# Patient Record
Sex: Male | Born: 1955 | Race: White | Hispanic: No | Marital: Married | Smoking: Current some day smoker
Health system: Southern US, Community
[De-identification: ages and names within clinical notes are randomized; demographics above are authoritative.]

## PROBLEM LIST (undated history)

## (undated) DIAGNOSIS — M1990 Unspecified osteoarthritis, unspecified site: Secondary | ICD-10-CM

## (undated) DIAGNOSIS — E119 Type 2 diabetes mellitus without complications: Secondary | ICD-10-CM

## (undated) DIAGNOSIS — Z9989 Dependence on other enabling machines and devices: Secondary | ICD-10-CM

## (undated) DIAGNOSIS — M25579 Pain in unspecified ankle and joints of unspecified foot: Secondary | ICD-10-CM

## (undated) DIAGNOSIS — M792 Neuralgia and neuritis, unspecified: Secondary | ICD-10-CM

## (undated) DIAGNOSIS — I10 Essential (primary) hypertension: Secondary | ICD-10-CM

## (undated) DIAGNOSIS — R7303 Prediabetes: Secondary | ICD-10-CM

## (undated) DIAGNOSIS — G4733 Obstructive sleep apnea (adult) (pediatric): Secondary | ICD-10-CM

## (undated) DIAGNOSIS — E7800 Pure hypercholesterolemia, unspecified: Secondary | ICD-10-CM

## (undated) DIAGNOSIS — F32A Depression, unspecified: Secondary | ICD-10-CM

## (undated) DIAGNOSIS — F329 Major depressive disorder, single episode, unspecified: Secondary | ICD-10-CM

## (undated) HISTORY — PX: ROTATOR CUFF REPAIR: SHX139

## (undated) HISTORY — PX: VASECTOMY REVERSAL: SHX243

## (undated) HISTORY — PX: VASECTOMY: SHX75

## (undated) HISTORY — PX: OTHER SURGICAL HISTORY: SHX169

---

## 2018-12-03 ENCOUNTER — Other Ambulatory Visit: Payer: Self-pay | Admitting: Orthopedic Surgery

## 2018-12-15 NOTE — Patient Instructions (Addendum)
Lyndell Allaire  12/15/2018   Your procedure is scheduled on: 12-25-2018    Report to [facility] Main  Entrance     Report to admitting at 7:30AM    Call this number if you have problems the morning of surgery [phone]   PLEASE BRING CPAP MASK AND  TUBING ONLY. DEVICE WILL BE PROVIDED!     Remember: Do not eat food or drink liquids :After Midnight. BRUSH YOUR TEETH MORNING OF SURGERY AND RINSE YOUR MOUTH OUT, NO CHEWING GUM CANDY OR MINTS.     Take these medicines the morning of surgery with A SIP OF WATER: ALLOPURINOL, AMLODIPINE, GEMFIBROZIL, METOPROLOL, ROSUVASTATIN                                You may not have any metal on your body including hair pins and              piercings  Do not wear jewelry, make-up, lotions, powders or perfumes, deodorant                      Men may shave face and neck.   Do not bring valuables to the hospital. [facility] IS NOT             RESPONSIBLE   FOR VALUABLES.  Contacts, dentures or bridgework may not be worn into surgery.  Leave suitcase in the car. After surgery it may be brought to your room.                  Please read over the following fact sheets you were given: _____________________________________________________________________             [facility] - Preparing for Surgery Before surgery, you can play an important role.  Because skin is not sterile, your skin needs to be as free of germs as possible.  You can reduce the number of germs on your skin by washing with CHG (chlorahexidine gluconate) soap before surgery.  CHG is an antiseptic cleaner which kills germs and bonds with the skin to continue killing germs even after washing. Please DO NOT use if you have an allergy to CHG or antibacterial soaps.  If your skin becomes reddened/irritated stop using the CHG and inform your nurse when you arrive at Short Stay. Do not shave (including legs and underarms) for at least 48 hours prior to the  first CHG shower.  You may shave your face/neck. Please follow these instructions carefully:  1.  Shower with CHG Soap the night before surgery and the  morning of Surgery.  2.  If you choose to wash your hair, wash your hair first as usual with your  normal  shampoo.  3.  After you shampoo, rinse your hair and body thoroughly to remove the  shampoo.                           4.  Use CHG as you would any other liquid soap.  You can apply chg directly  to the skin and wash                       Gently with a scrungie or clean washcloth.  5.  Apply the CHG Soap to your body ONLY  FROM THE NECK DOWN.   Do not use on face/ open                           Wound or open sores. Avoid contact with eyes, ears mouth and genitals (private parts).                       Wash face,  Genitals (private parts) with your normal soap.             6.  Wash thoroughly, paying special attention to the area where your surgery  will be performed.  7.  Thoroughly rinse your body with warm water from the neck down.  8.  DO NOT shower/wash with your normal soap after using and rinsing off  the CHG Soap.                9.  Pat yourself dry with a clean towel.            10.  Wear clean pajamas.            11.  Place clean sheets on your bed the night of your first shower and do not  sleep with pets. Day of Surgery : Do not apply any lotions/deodorants the morning of surgery.  Please wear clean clothes to the hospital/surgery center.  FAILURE TO FOLLOW THESE INSTRUCTIONS MAY RESULT IN THE CANCELLATION OF YOUR SURGERY PATIENT SIGNATURE_________________________________  NURSE SIGNATURE__________________________________  ________________________________________________________________________   Rogelia Mire  An incentive spirometer is a tool that can help keep your lungs clear and active. This tool measures how well you are filling your lungs with each breath. Taking long deep breaths may help reverse or decrease  the chance of developing breathing (pulmonary) problems (especially infection) following:  A long period of time when you are unable to move or be active. BEFORE THE PROCEDURE   If the spirometer includes an indicator to show your best effort, your nurse or respiratory therapist will set it to a desired goal.  If possible, sit up straight or lean slightly forward. Try not to slouch.  Hold the incentive spirometer in an upright position. INSTRUCTIONS FOR USE  1. Sit on the edge of your bed if possible, or sit up as far as you can in bed or on a chair. 2. Hold the incentive spirometer in an upright position. 3. Breathe out normally. 4. Place the mouthpiece in your mouth and seal your lips tightly around it. 5. Breathe in slowly and as deeply as possible, raising the piston or the ball toward the top of the column. 6. Hold your breath for 3-5 seconds or for as long as possible. Allow the piston or ball to fall to the bottom of the column. 7. Remove the mouthpiece from your mouth and breathe out normally. 8. Rest for a few seconds and repeat Steps 1 through 7 at least 10 times every 1-2 hours when you are awake. Take your time and take a few normal breaths between deep breaths. 9. The spirometer may include an indicator to show your best effort. Use the indicator as a goal to work toward during each repetition. 10. After each set of 10 deep breaths, practice coughing to be sure your lungs are clear. If you have an incision (the cut made at the time of surgery), support your incision when coughing by placing a pillow or rolled up towels firmly against it. Once you  are able to get out of bed, walk around indoors and cough well. You may stop using the incentive spirometer when instructed by your caregiver.  RISKS AND COMPLICATIONS  Take your time so you do not get dizzy or light-headed.  If you are in pain, you may need to take or ask for pain medication before doing incentive spirometry. It is  harder to take a deep breath if you are having pain. AFTER USE  Rest and breathe slowly and easily.  It can be helpful to keep track of a log of your progress. Your caregiver can provide you with a simple table to help with this. If you are using the spirometer at home, follow these instructions: SEEK MEDICAL CARE IF:   You are having difficultly using the spirometer.  You have trouble using the spirometer as often as instructed.  Your pain medication is not giving enough relief while using the spirometer.  You develop fever of 100.5 F (38.1 C) or higher. SEEK IMMEDIATE MEDICAL CARE IF:   You cough up bloody sputum that had not been present before.  You develop fever of 102 F (38.9 C) or greater.  You develop worsening pain at or near the incision site. MAKE SURE YOU:   Understand these instructions.  Will watch your condition.  Will get help right away if you are not doing well or get worse. Document Released: 03/10/2007 Document Revised: 01/20/2012 Document Reviewed: 05/11/2007 [facility] Patient Information 2014 [place], [place].   ________________________________________________________________________

## 2018-12-17 ENCOUNTER — Encounter (HOSPITAL_COMMUNITY): Payer: Self-pay

## 2018-12-17 ENCOUNTER — Other Ambulatory Visit: Payer: Self-pay

## 2018-12-17 ENCOUNTER — Ambulatory Visit (HOSPITAL_COMMUNITY)
Admission: RE | Admit: 2018-12-17 | Discharge: 2018-12-17 | Disposition: A | Discharge status: Home / Self Care | Payer: Worker's Compensation | Source: Ambulatory Visit | Attending: Orthopedic Surgery | Admitting: Orthopedic Surgery

## 2018-12-17 ENCOUNTER — Inpatient Hospital Stay (HOSPITAL_COMMUNITY): Admission: RE | Admit: 2018-12-17 | Payer: Self-pay | Source: Ambulatory Visit

## 2018-12-17 ENCOUNTER — Encounter (HOSPITAL_COMMUNITY)
Admission: RE | Admit: 2018-12-17 | Discharge: 2018-12-17 | Disposition: A | Discharge status: Home / Self Care | Payer: Worker's Compensation | Source: Ambulatory Visit | Attending: Orthopedic Surgery | Admitting: Orthopedic Surgery

## 2018-12-17 DIAGNOSIS — Z01811 Encounter for preprocedural respiratory examination: Secondary | ICD-10-CM | POA: Diagnosis present

## 2018-12-17 HISTORY — DX: Essential (primary) hypertension: I10

## 2018-12-17 HISTORY — DX: Unspecified osteoarthritis, unspecified site: M19.90

## 2018-12-17 HISTORY — DX: Pure hypercholesterolemia, unspecified: E78.00

## 2018-12-17 HISTORY — DX: Obstructive sleep apnea (adult) (pediatric): G47.33

## 2018-12-17 HISTORY — DX: Prediabetes: R73.03

## 2018-12-17 HISTORY — DX: Dependence on other enabling machines and devices: Z99.89

## 2018-12-17 HISTORY — DX: Major depressive disorder, single episode, unspecified: F32.9

## 2018-12-17 HISTORY — DX: Neuralgia and neuritis, unspecified: M79.2

## 2018-12-17 HISTORY — DX: Depression, unspecified: F32.A

## 2018-12-17 HISTORY — DX: Pain in unspecified ankle and joints of unspecified foot: M25.579

## 2018-12-17 LAB — CBC WITH DIFFERENTIAL/PLATELET
Abs Immature Granulocytes: 0.01 10*3/uL (ref 0.00–0.07)
BASOS PCT: 1 %
Basophils Absolute: 0 10*3/uL (ref 0.0–0.1)
EOS ABS: 0.9 10*3/uL — AB (ref 0.0–0.5)
Eosinophils Relative: 13 %
HCT: 44.2 % (ref 39.0–52.0)
Hemoglobin: 14.4 g/dL (ref 13.0–17.0)
Immature Granulocytes: 0 %
Lymphocytes Relative: 30 %
Lymphs Abs: 2 10*3/uL (ref 0.7–4.0)
MCH: 30.5 pg (ref 26.0–34.0)
MCHC: 32.6 g/dL (ref 30.0–36.0)
MCV: 93.6 fL (ref 80.0–100.0)
Monocytes Absolute: 0.5 10*3/uL (ref 0.1–1.0)
Monocytes Relative: 8 %
Neutro Abs: 3.2 10*3/uL (ref 1.7–7.7)
Neutrophils Relative %: 48 %
Platelets: 339 10*3/uL (ref 150–400)
RBC: 4.72 MIL/uL (ref 4.22–5.81)
RDW: 13.6 % (ref 11.5–15.5)
WBC: 6.6 10*3/uL (ref 4.0–10.5)
nRBC: 0 % (ref 0.0–0.2)

## 2018-12-17 LAB — ABO/RH: ABO/RH(D): O POS

## 2018-12-17 LAB — COMPREHENSIVE METABOLIC PANEL
ALT: 77 U/L — ABNORMAL HIGH (ref 0–44)
ANION GAP: 10 (ref 5–15)
AST: 140 U/L — ABNORMAL HIGH (ref 15–41)
Albumin: 4.2 g/dL (ref 3.5–5.0)
Alkaline Phosphatase: 75 U/L (ref 38–126)
BUN: 18 mg/dL (ref 6–20)
CO2: 27 mmol/L (ref 22–32)
Calcium: 9.7 mg/dL (ref 8.9–10.3)
Chloride: 100 mmol/L (ref 98–111)
Creatinine, Ser: 1.04 mg/dL (ref 0.61–1.24)
GFR calc Af Amer: >60 mL/min (ref >60)
GFR calc non Af Amer: >60 mL/min (ref >60)
Glucose, Bld: 131 mg/dL — ABNORMAL HIGH (ref 70–99)
Potassium: 4 mmol/L (ref 3.5–5.1)
Sodium: 137 mmol/L (ref 135–145)
Total Bilirubin: 0.4 mg/dL (ref 0.3–1.2)
Total Protein: 7.6 g/dL (ref 6.5–8.1)

## 2018-12-17 LAB — URINALYSIS, ROUTINE W REFLEX MICROSCOPIC
Bilirubin Urine: NEGATIVE
Glucose, UA: NEGATIVE mg/dL
Hgb urine dipstick: NEGATIVE
KETONES UR: NEGATIVE mg/dL
Leukocytes, UA: NEGATIVE
Nitrite: NEGATIVE
Protein, ur: NEGATIVE mg/dL
Specific Gravity, Urine: 1.015 (ref 1.005–1.030)
pH: 6 (ref 5.0–8.0)

## 2018-12-17 LAB — APTT: aPTT: 33 seconds (ref 24–36)

## 2018-12-17 LAB — HEMOGLOBIN A1C
Hgb A1c MFr Bld: 6.4 % — ABNORMAL HIGH (ref 4.8–5.6)
Mean Plasma Glucose: 136.98 mg/dL

## 2018-12-17 LAB — PROTIME-INR
INR: 0.99
PROTHROMBIN TIME: 13 s (ref 11.4–15.2)

## 2018-12-17 LAB — SURGICAL PCR SCREEN
MRSA, PCR: NEGATIVE
Staphylococcus aureus: POSITIVE — AB

## 2018-12-22 NOTE — Progress Notes (Signed)
Anesthesia Chart Review   Case:  [id number] Date/Time:  12/25/18 5631   Procedure:  RIGHT TOTAL KNEE ARTHROPLASTY (Right )   Anesthesia type:  General   Pre-op diagnosis:  DEGENERATIVE JOINT DISEASE RIGHT KNEE   Location:  [facility] ROOM 08 / [facility]   Surgeon:  Jodi Geralds, MD      DISCUSSION: 63 yo current some day smoker with h/o HTN, pre-diabetes, depression, OSA on CPAP, right knee DJD scheduled for above surgery on 12/25/18 with Dr. Jodi Geralds.   AST 140 ALT 77 at PST visit on 12/17/18.  Previous labs dated 09/22/17 reviewed from PCP, normal at this time.  Discussed with Dr. Lilli Light and Dr. Michelle Piper.  Most likely due to medications, ok to proceed with planned procedure.    VS: BP (!) 146/77   Pulse 66   Temp 36.9 C (Oral)   Resp 18   Ht 5' 11.5" (1.816 m)   Wt 113.4 kg   SpO2 97%   BMI 34.38 kg/m   PROVIDERS: Oval Linsey, PA-C is PCP   LABS: Labs reviewed: Acceptable for surgery. (all labs ordered are listed, but only abnormal results are displayed)  Labs Reviewed  SURGICAL PCR SCREEN - Abnormal; Notable for the following components:      Result Value   Staphylococcus aureus POSITIVE (*)    All other components within normal limits  CBC WITH DIFFERENTIAL/PLATELET - Abnormal; Notable for the following components:   Eosinophils Absolute 0.9 (*)    All other components within normal limits  COMPREHENSIVE METABOLIC PANEL - Abnormal; Notable for the following components:   Glucose, Bld 131 (*)    AST 140 (*)    ALT 77 (*)    All other components within normal limits  HEMOGLOBIN A1C - Abnormal; Notable for the following components:   Hgb A1c MFr Bld 6.4 (*)    All other components within normal limits  URINALYSIS, ROUTINE W REFLEX MICROSCOPIC  APTT  PROTIME-INR  TYPE AND SCREEN  ABO/RH     IMAGES: Chest Xray 12/17/2018 FINDINGS: Cardiomediastinal silhouette is normal. Mediastinal contours appear intact.  There is no evidence of focal airspace consolidation,  pleural effusion or pneumothorax.  Osseous structures are without acute abnormality. Soft tissues are grossly normal.  IMPRESSION: No active cardiopulmonary disease.  EKG: 12/17/2018 Normal sinus rhythm  Normal ECG No previous tracing   CV:  Past Medical History:  Diagnosis Date  . Ankle pain   . Depression    "secondary to pain"   . DJD (degenerative joint disease)   . Elevated cholesterol   . Hypertension   . Nerve pain   . OSA on CPAP   . Pre-diabetes    "borderline"    Past Surgical History:  Procedure Laterality Date  . arthroscopy  Right    knee x2  . ROTATOR CUFF REPAIR Bilateral   . VASECTOMY    . VASECTOMY REVERSAL      MEDICATIONS: . allopurinol (ZYLOPRIM) 100 MG tablet  . amLODipine (NORVASC) 10 MG tablet  . cholecalciferol (VITAMIN D3) 25 MCG (1000 UT) tablet  . gemfibrozil (LOPID) 600 MG tablet  . hydrochlorothiazide (HYDRODIURIL) 25 MG tablet  . losartan (COZAAR) 100 MG tablet  . meloxicam (MOBIC) 7.5 MG tablet  . metoprolol tartrate (LOPRESSOR) 50 MG tablet  . Multiple Vitamins-Minerals (MULTIVITAMIN WITH MINERALS) tablet  . Omega-3 1000 MG CAPS  . rosuvastatin (CRESTOR) 20 MG tablet   No current facility-administered medications for this encounter.      Shanda Bumps  [facility] Pre-Surgical Testing [phone] 12/23/18 12:13 PM

## 2018-12-23 NOTE — Anesthesia Preprocedure Evaluation (Addendum)
Anesthesia Evaluation  Patient identified by MRN, date of birth, ID band Patient awake    Reviewed: Allergy & Precautions, NPO status , Patient's Chart, lab work & pertinent test results, reviewed documented beta blocker date and time   Airway Mallampati: II  TM Distance: <3 FB Neck ROM: Full    Dental  (+) Dental Advisory Given   Pulmonary Current Smoker,    breath sounds clear to auscultation       Cardiovascular hypertension, Pt. on medications and Pt. on home beta blockers  Rhythm:Regular Rate:Normal     Neuro/Psych negative neurological ROS     GI/Hepatic negative GI ROS, Neg liver ROS,   Endo/Other  negative endocrine ROS  Renal/GU negative Renal ROS     Musculoskeletal  (+) Arthritis ,   Abdominal   Peds  Hematology negative hematology ROS (+)   Anesthesia Other Findings   Reproductive/Obstetrics                            Lab Results  Component Value Date   WBC 6.6 12/17/2018   HGB 14.4 12/17/2018   HCT 44.2 12/17/2018   MCV 93.6 12/17/2018   PLT 339 12/17/2018   Lab Results  Component Value Date   CREATININE 1.04 12/17/2018   BUN 18 12/17/2018   NA 137 12/17/2018   K 4.0 12/17/2018   CL 100 12/17/2018   CO2 27 12/17/2018   Lab Results  Component Value Date   INR 0.99 12/17/2018    Anesthesia Physical Anesthesia Plan  ASA: II  Anesthesia Plan: MAC and Spinal   Post-op Pain Management:  Regional for Post-op pain   Induction: Intravenous  PONV Risk Score and Plan: 1 and Propofol infusion, Ondansetron and Treatment may vary due to age or medical condition  Airway Management Planned: Simple Face Mask and Natural Airway  Additional Equipment:   Intra-op Plan:   Post-operative Plan:   Informed Consent: I have reviewed the patients History and Physical, chart, labs and discussed the procedure including the risks, benefits and alternatives for the proposed  anesthesia with the patient or authorized representative who has indicated his/her understanding and acceptance.     Dental advisory given  Plan Discussed with: CRNA  Anesthesia Plan Comments:        Anesthesia Quick Evaluation

## 2018-12-24 MED ORDER — BUPIVACAINE LIPOSOME 1.3 % IJ SUSP
20.0000 mL | INTRAMUSCULAR | Status: DC
  Filled 2018-12-24: qty 20

## 2018-12-24 NOTE — Progress Notes (Signed)
rn spoke with patient to make aware he will now check in at 0700 at admitting tomorrow for surgery. Patient verbalized understanding

## 2018-12-25 ENCOUNTER — Observation Stay (HOSPITAL_COMMUNITY)
Admission: RE | Admit: 2018-12-25 | Discharge: 2018-12-26 | Disposition: A | Discharge status: Home / Self Care | Payer: Worker's Compensation | Attending: Orthopedic Surgery | Admitting: Orthopedic Surgery

## 2018-12-25 ENCOUNTER — Ambulatory Visit (HOSPITAL_COMMUNITY): Payer: Worker's Compensation | Admitting: Physician Assistant

## 2018-12-25 ENCOUNTER — Encounter (HOSPITAL_COMMUNITY)
Admission: RE | Disposition: A | Discharge status: Home / Self Care | Payer: Self-pay | Source: Home / Self Care | Attending: Orthopedic Surgery

## 2018-12-25 ENCOUNTER — Encounter (HOSPITAL_COMMUNITY): Payer: Self-pay | Admitting: Emergency Medicine

## 2018-12-25 ENCOUNTER — Ambulatory Visit (HOSPITAL_COMMUNITY): Payer: Worker's Compensation | Admitting: Anesthesiology

## 2018-12-25 ENCOUNTER — Other Ambulatory Visit: Payer: Self-pay

## 2018-12-25 DIAGNOSIS — F329 Major depressive disorder, single episode, unspecified: Secondary | ICD-10-CM | POA: Insufficient documentation

## 2018-12-25 DIAGNOSIS — G4733 Obstructive sleep apnea (adult) (pediatric): Secondary | ICD-10-CM | POA: Insufficient documentation

## 2018-12-25 DIAGNOSIS — M1711 Unilateral primary osteoarthritis, right knee: Secondary | ICD-10-CM | POA: Diagnosis not present

## 2018-12-25 DIAGNOSIS — E7800 Pure hypercholesterolemia, unspecified: Secondary | ICD-10-CM | POA: Insufficient documentation

## 2018-12-25 DIAGNOSIS — Z791 Long term (current) use of non-steroidal anti-inflammatories (NSAID): Secondary | ICD-10-CM | POA: Insufficient documentation

## 2018-12-25 DIAGNOSIS — R2689 Other abnormalities of gait and mobility: Secondary | ICD-10-CM | POA: Diagnosis not present

## 2018-12-25 DIAGNOSIS — R7303 Prediabetes: Secondary | ICD-10-CM | POA: Diagnosis not present

## 2018-12-25 DIAGNOSIS — R262 Difficulty in walking, not elsewhere classified: Secondary | ICD-10-CM | POA: Diagnosis not present

## 2018-12-25 DIAGNOSIS — F17210 Nicotine dependence, cigarettes, uncomplicated: Secondary | ICD-10-CM | POA: Diagnosis not present

## 2018-12-25 DIAGNOSIS — Z79899 Other long term (current) drug therapy: Secondary | ICD-10-CM | POA: Insufficient documentation

## 2018-12-25 DIAGNOSIS — M25579 Pain in unspecified ankle and joints of unspecified foot: Secondary | ICD-10-CM | POA: Diagnosis not present

## 2018-12-25 DIAGNOSIS — I10 Essential (primary) hypertension: Secondary | ICD-10-CM | POA: Diagnosis not present

## 2018-12-25 HISTORY — PX: TOTAL KNEE ARTHROPLASTY: SHX125

## 2018-12-25 LAB — TYPE AND SCREEN
ABO/RH(D): O POS
Antibody Screen: NEGATIVE

## 2018-12-25 SURGERY — ARTHROPLASTY, KNEE, TOTAL
Anesthesia: Monitor Anesthesia Care | Site: Knee | Laterality: Right

## 2018-12-25 MED ORDER — CHLORHEXIDINE GLUCONATE 4 % EX LIQD: 60.0000 mL | Freq: Once | CUTANEOUS | Status: DC

## 2018-12-25 MED ORDER — MAGNESIUM CITRATE PO SOLN: 1.0000 | Freq: Once | ORAL | Status: DC | PRN

## 2018-12-25 MED ORDER — GEMFIBROZIL 600 MG PO TABS
600.0000 mg | ORAL_TABLET | Freq: Every day | ORAL | Status: DC
  Administered 2018-12-26: 600 mg via ORAL
  Filled 2018-12-25: qty 1

## 2018-12-25 MED ORDER — HYDROMORPHONE HCL 1 MG/ML IJ SOLN
INTRAMUSCULAR | Status: AC
  Filled 2018-12-25: qty 1

## 2018-12-25 MED ORDER — METHOCARBAMOL 500 MG IVPB - SIMPLE MED
INTRAVENOUS | Status: AC
  Filled 2018-12-25: qty 50

## 2018-12-25 MED ORDER — MIDAZOLAM HCL 2 MG/2ML IJ SOLN
INTRAMUSCULAR | Status: AC
  Filled 2018-12-25: qty 2

## 2018-12-25 MED ORDER — OXYCODONE HCL 5 MG PO TABS
5.0000 mg | ORAL_TABLET | ORAL | Status: DC | PRN
  Administered 2018-12-25: 10 mg via ORAL
  Administered 2018-12-25: 5 mg via ORAL
  Administered 2018-12-26 (×2): 10 mg via ORAL
  Filled 2018-12-25: qty 2
  Filled 2018-12-25: qty 1
  Filled 2018-12-25 (×2): qty 2

## 2018-12-25 MED ORDER — LOSARTAN POTASSIUM 50 MG PO TABS
100.0000 mg | ORAL_TABLET | Freq: Every day | ORAL | Status: DC
  Administered 2018-12-26: 100 mg via ORAL
  Filled 2018-12-25: qty 2

## 2018-12-25 MED ORDER — LACTATED RINGERS IV SOLN
INTRAVENOUS | Status: DC
  Administered 2018-12-25 (×2): via INTRAVENOUS

## 2018-12-25 MED ORDER — DOCUSATE SODIUM 100 MG PO CAPS: 100.0000 mg | ORAL_CAPSULE | Freq: Two times a day (BID) | ORAL | 0 refills | Status: DC

## 2018-12-25 MED ORDER — PROPOFOL 500 MG/50ML IV EMUL
INTRAVENOUS | Status: DC | PRN
  Administered 2018-12-25: 100 ug/kg/min via INTRAVENOUS

## 2018-12-25 MED ORDER — BUPIVACAINE-EPINEPHRINE (PF) 0.5% -1:200000 IJ SOLN
INTRAMUSCULAR | Status: AC
  Filled 2018-12-25: qty 30

## 2018-12-25 MED ORDER — TRANEXAMIC ACID-NACL 1000-0.7 MG/100ML-% IV SOLN
1000.0000 mg | Freq: Once | INTRAVENOUS | Status: AC
  Administered 2018-12-25: 1000 mg via INTRAVENOUS
  Filled 2018-12-25: qty 100

## 2018-12-25 MED ORDER — FENTANYL CITRATE (PF) 100 MCG/2ML IJ SOLN
50.0000 ug | INTRAMUSCULAR | Status: DC
  Administered 2018-12-25: 50 ug via INTRAVENOUS
  Filled 2018-12-25: qty 2

## 2018-12-25 MED ORDER — WATER FOR IRRIGATION, STERILE IR SOLN
Status: DC | PRN
  Administered 2018-12-25: 1000 mL

## 2018-12-25 MED ORDER — BUPIVACAINE LIPOSOME 1.3 % IJ SUSP
INTRAMUSCULAR | Status: DC | PRN
  Administered 2018-12-25: 20 mL

## 2018-12-25 MED ORDER — BUPIVACAINE IN DEXTROSE 0.75-8.25 % IT SOLN
INTRATHECAL | Status: DC | PRN
  Administered 2018-12-25: 1.6 mL via INTRATHECAL

## 2018-12-25 MED ORDER — DOCUSATE SODIUM 100 MG PO CAPS
100.0000 mg | ORAL_CAPSULE | Freq: Two times a day (BID) | ORAL | Status: DC
  Administered 2018-12-25 – 2018-12-26 (×2): 100 mg via ORAL
  Filled 2018-12-25 (×2): qty 1

## 2018-12-25 MED ORDER — CLONIDINE HCL (ANALGESIA) 100 MCG/ML EP SOLN
EPIDURAL | Status: DC | PRN
  Administered 2018-12-25: 50 ug

## 2018-12-25 MED ORDER — ASPIRIN EC 325 MG PO TBEC: 325.0000 mg | DELAYED_RELEASE_TABLET | Freq: Two times a day (BID) | ORAL | 0 refills | Status: DC

## 2018-12-25 MED ORDER — PROPOFOL 10 MG/ML IV BOLUS
INTRAVENOUS | Status: AC
  Filled 2018-12-25: qty 20

## 2018-12-25 MED ORDER — POLYETHYLENE GLYCOL 3350 17 G PO PACK: 17.0000 g | PACK | Freq: Every day | ORAL | Status: DC | PRN

## 2018-12-25 MED ORDER — MIDAZOLAM HCL 2 MG/2ML IJ SOLN
1.0000 mg | INTRAMUSCULAR | Status: DC
  Administered 2018-12-25: 2 mg via INTRAVENOUS
  Filled 2018-12-25: qty 2

## 2018-12-25 MED ORDER — TIZANIDINE HCL 2 MG PO TABS: 2.0000 mg | ORAL_TABLET | Freq: Three times a day (TID) | ORAL | 0 refills | Status: DC | PRN

## 2018-12-25 MED ORDER — CELECOXIB 200 MG PO CAPS
200.0000 mg | ORAL_CAPSULE | Freq: Two times a day (BID) | ORAL | Status: DC
  Administered 2018-12-25 – 2018-12-26 (×3): 200 mg via ORAL
  Filled 2018-12-25 (×3): qty 1

## 2018-12-25 MED ORDER — SODIUM CHLORIDE (PF) 0.9 % IJ SOLN
INTRAMUSCULAR | Status: AC
  Filled 2018-12-25: qty 50

## 2018-12-25 MED ORDER — ONDANSETRON HCL 4 MG/2ML IJ SOLN
INTRAMUSCULAR | Status: DC | PRN
  Administered 2018-12-25: 4 mg via INTRAVENOUS

## 2018-12-25 MED ORDER — ALLOPURINOL 100 MG PO TABS
100.0000 mg | ORAL_TABLET | Freq: Every day | ORAL | Status: DC
  Administered 2018-12-26: 100 mg via ORAL
  Filled 2018-12-25: qty 1

## 2018-12-25 MED ORDER — PROPOFOL 10 MG/ML IV BOLUS
INTRAVENOUS | Status: AC
  Filled 2018-12-25: qty 60

## 2018-12-25 MED ORDER — METOPROLOL TARTRATE 50 MG PO TABS
50.0000 mg | ORAL_TABLET | Freq: Two times a day (BID) | ORAL | Status: DC
  Administered 2018-12-25 – 2018-12-26 (×2): 50 mg via ORAL
  Filled 2018-12-25 (×2): qty 1

## 2018-12-25 MED ORDER — ACETAMINOPHEN 325 MG PO TABS: 325.0000 mg | ORAL_TABLET | Freq: Four times a day (QID) | ORAL | Status: DC | PRN

## 2018-12-25 MED ORDER — SODIUM CHLORIDE 0.9 % IV SOLN
INTRAVENOUS | Status: DC
  Administered 2018-12-25: 16:00:00 via INTRAVENOUS

## 2018-12-25 MED ORDER — HYDROCHLOROTHIAZIDE 25 MG PO TABS
25.0000 mg | ORAL_TABLET | Freq: Every day | ORAL | Status: DC
  Administered 2018-12-26: 25 mg via ORAL
  Filled 2018-12-25: qty 1

## 2018-12-25 MED ORDER — HYDROMORPHONE HCL 1 MG/ML IJ SOLN
0.5000 mg | INTRAMUSCULAR | Status: DC | PRN
  Administered 2018-12-25: 1 mg via INTRAVENOUS
  Filled 2018-12-25: qty 1

## 2018-12-25 MED ORDER — ONDANSETRON HCL 4 MG/2ML IJ SOLN: 4.0000 mg | Freq: Four times a day (QID) | INTRAMUSCULAR | Status: DC | PRN

## 2018-12-25 MED ORDER — DEXAMETHASONE SODIUM PHOSPHATE 10 MG/ML IJ SOLN
10.0000 mg | Freq: Two times a day (BID) | INTRAMUSCULAR | Status: DC
  Administered 2018-12-25 – 2018-12-26 (×2): 10 mg via INTRAVENOUS
  Filled 2018-12-25 (×2): qty 1

## 2018-12-25 MED ORDER — ROSUVASTATIN CALCIUM 10 MG PO TABS
10.0000 mg | ORAL_TABLET | Freq: Every day | ORAL | Status: DC
  Administered 2018-12-26: 10 mg via ORAL
  Filled 2018-12-25: qty 1

## 2018-12-25 MED ORDER — SODIUM CHLORIDE 0.9% FLUSH
INTRAVENOUS | Status: DC | PRN
  Administered 2018-12-25: 50 mL

## 2018-12-25 MED ORDER — DEXAMETHASONE SODIUM PHOSPHATE 10 MG/ML IJ SOLN
INTRAMUSCULAR | Status: AC
  Filled 2018-12-25: qty 1

## 2018-12-25 MED ORDER — ONDANSETRON HCL 4 MG/2ML IJ SOLN
INTRAMUSCULAR | Status: AC
  Filled 2018-12-25: qty 2

## 2018-12-25 MED ORDER — TRANEXAMIC ACID-NACL 1000-0.7 MG/100ML-% IV SOLN
1000.0000 mg | INTRAVENOUS | Status: AC
  Administered 2018-12-25: 1000 mg via INTRAVENOUS
  Filled 2018-12-25: qty 100

## 2018-12-25 MED ORDER — AMLODIPINE BESYLATE 10 MG PO TABS
10.0000 mg | ORAL_TABLET | Freq: Every day | ORAL | Status: DC
  Administered 2018-12-26: 10 mg via ORAL
  Filled 2018-12-25: qty 1

## 2018-12-25 MED ORDER — HYDROMORPHONE HCL 1 MG/ML IJ SOLN
0.2500 mg | INTRAMUSCULAR | Status: DC | PRN
  Administered 2018-12-25 (×3): 0.5 mg via INTRAVENOUS

## 2018-12-25 MED ORDER — EPHEDRINE 5 MG/ML INJ
INTRAVENOUS | Status: AC
  Filled 2018-12-25: qty 10

## 2018-12-25 MED ORDER — PROPOFOL 10 MG/ML IV BOLUS
INTRAVENOUS | Status: DC | PRN
  Administered 2018-12-25 (×3): 20 mg via INTRAVENOUS

## 2018-12-25 MED ORDER — BISACODYL 5 MG PO TBEC: 5.0000 mg | DELAYED_RELEASE_TABLET | Freq: Every day | ORAL | Status: DC | PRN

## 2018-12-25 MED ORDER — MIDAZOLAM HCL 5 MG/5ML IJ SOLN
INTRAMUSCULAR | Status: DC | PRN
  Administered 2018-12-25: 2 mg via INTRAVENOUS

## 2018-12-25 MED ORDER — ASPIRIN EC 325 MG PO TBEC
325.0000 mg | DELAYED_RELEASE_TABLET | Freq: Two times a day (BID) | ORAL | Status: DC
  Administered 2018-12-25 – 2018-12-26 (×2): 325 mg via ORAL
  Filled 2018-12-25 (×2): qty 1

## 2018-12-25 MED ORDER — METHOCARBAMOL 500 MG PO TABS
500.0000 mg | ORAL_TABLET | Freq: Four times a day (QID) | ORAL | Status: DC | PRN
  Administered 2018-12-25 – 2018-12-26 (×3): 500 mg via ORAL
  Filled 2018-12-25 (×3): qty 1

## 2018-12-25 MED ORDER — BUPIVACAINE-EPINEPHRINE 0.5% -1:200000 IJ SOLN
INTRAMUSCULAR | Status: DC | PRN
  Administered 2018-12-25: 30 mL

## 2018-12-25 MED ORDER — METHOCARBAMOL 500 MG IVPB - SIMPLE MED
500.0000 mg | Freq: Four times a day (QID) | INTRAVENOUS | Status: DC | PRN
  Administered 2018-12-25: 500 mg via INTRAVENOUS
  Filled 2018-12-25: qty 50

## 2018-12-25 MED ORDER — DEXAMETHASONE SODIUM PHOSPHATE 10 MG/ML IJ SOLN
INTRAMUSCULAR | Status: DC | PRN
  Administered 2018-12-25: 10 mg via INTRAVENOUS

## 2018-12-25 MED ORDER — CEFAZOLIN SODIUM-DEXTROSE 2-4 GM/100ML-% IV SOLN
2.0000 g | Freq: Four times a day (QID) | INTRAVENOUS | Status: AC
  Administered 2018-12-25 (×2): 2 g via INTRAVENOUS
  Filled 2018-12-25 (×2): qty 100

## 2018-12-25 MED ORDER — CEFAZOLIN SODIUM-DEXTROSE 2-4 GM/100ML-% IV SOLN
2.0000 g | INTRAVENOUS | Status: AC
  Administered 2018-12-25: 2 g via INTRAVENOUS
  Filled 2018-12-25: qty 100

## 2018-12-25 MED ORDER — ONDANSETRON HCL 4 MG PO TABS: 4.0000 mg | ORAL_TABLET | Freq: Four times a day (QID) | ORAL | Status: DC | PRN

## 2018-12-25 MED ORDER — BUPIVACAINE-EPINEPHRINE (PF) 0.5% -1:200000 IJ SOLN
INTRAMUSCULAR | Status: DC | PRN
  Administered 2018-12-25: 20 mL via PERINEURAL

## 2018-12-25 MED ORDER — OXYCODONE-ACETAMINOPHEN 5-325 MG PO TABS: 1.0000 | ORAL_TABLET | Freq: Four times a day (QID) | ORAL | 0 refills | Status: DC | PRN

## 2018-12-25 MED ORDER — DIPHENHYDRAMINE HCL 12.5 MG/5ML PO ELIX: 12.5000 mg | ORAL_SOLUTION | ORAL | Status: DC | PRN

## 2018-12-25 MED ORDER — SODIUM CHLORIDE 0.9 % IR SOLN
Status: DC | PRN
  Administered 2018-12-25 (×2): 1000 mL

## 2018-12-25 MED ORDER — GABAPENTIN 300 MG PO CAPS
300.0000 mg | ORAL_CAPSULE | Freq: Two times a day (BID) | ORAL | Status: DC
  Administered 2018-12-25 – 2018-12-26 (×3): 300 mg via ORAL
  Filled 2018-12-25 (×3): qty 1

## 2018-12-25 MED ORDER — ALUM & MAG HYDROXIDE-SIMETH 200-200-20 MG/5ML PO SUSP: 30.0000 mL | ORAL | Status: DC | PRN

## 2018-12-25 MED ORDER — EPHEDRINE SULFATE-NACL 50-0.9 MG/10ML-% IV SOSY
PREFILLED_SYRINGE | INTRAVENOUS | Status: DC | PRN
  Administered 2018-12-25 (×2): 5 mg via INTRAVENOUS

## 2018-12-25 SURGICAL SUPPLY — 58 items
ATTUNE MED DOME PAT 41 KNEE (Knees) ×2 IMPLANT
ATTUNE MED DOME PAT 41MM KNEE (Knees) ×1 IMPLANT
ATTUNE PS FEM RT SZ 8 CEM KNEE (Femur) ×3 IMPLANT
ATTUNE PSRP INSR SZ8 5 KNEE (Insert) ×2 IMPLANT
ATTUNE PSRP INSR SZ8 5MM KNEE (Insert) ×1 IMPLANT
BAG ZIPLOCK 12X15 (MISCELLANEOUS) ×3 IMPLANT
BANDAGE ACE 6X5 VEL STRL LF (GAUZE/BANDAGES/DRESSINGS) ×3 IMPLANT
BASE TIBIAL ROT PLAT SZ 8 KNEE (Knees) ×1 IMPLANT
BENZOIN TINCTURE PRP APPL 2/3 (GAUZE/BANDAGES/DRESSINGS) ×3 IMPLANT
BLADE SAGITTAL 25.0X1.19X90 (BLADE) ×2 IMPLANT
BLADE SAGITTAL 25.0X1.19X90MM (BLADE) ×1
BLADE SAW SGTL 11.0X1.19X90.0M (BLADE) ×3 IMPLANT
BLADE SURG SZ10 CARB STEEL (BLADE) ×6 IMPLANT
BNDG ELASTIC 6X10 VLCR STRL LF (GAUZE/BANDAGES/DRESSINGS) ×3 IMPLANT
BOOTIES KNEE HIGH SLOAN (MISCELLANEOUS) ×3 IMPLANT
BOWL SMART MIX CTS (DISPOSABLE) ×3 IMPLANT
CEMENT HV SMART SET (Cement) ×6 IMPLANT
CLOSURE STERI-STRIP 1/2X4 (GAUZE/BANDAGES/DRESSINGS) ×1
CLOSURE WOUND 1/2 X4 (GAUZE/BANDAGES/DRESSINGS)
CLSR STERI-STRIP ANTIMIC 1/2X4 (GAUZE/BANDAGES/DRESSINGS) ×2 IMPLANT
COVER SURGICAL LIGHT HANDLE (MISCELLANEOUS) ×3 IMPLANT
COVER WAND RF STERILE (DRAPES) IMPLANT
CUFF TOURN SGL QUICK 34 (TOURNIQUET CUFF) ×2
CUFF TRNQT CYL 34X4X40X1 (TOURNIQUET CUFF) ×1 IMPLANT
DECANTER SPIKE VIAL GLASS SM (MISCELLANEOUS) IMPLANT
DRAPE U-SHAPE 47X51 STRL (DRAPES) ×3 IMPLANT
DRSG AQUACEL AG ADV 3.5X10 (GAUZE/BANDAGES/DRESSINGS) ×3 IMPLANT
DURAPREP 26ML APPLICATOR (WOUND CARE) ×3 IMPLANT
ELECT REM PT RETURN 15FT ADLT (MISCELLANEOUS) ×3 IMPLANT
GLOVE BIOGEL PI IND STRL 8 (GLOVE) ×2 IMPLANT
GLOVE BIOGEL PI INDICATOR 8 (GLOVE) ×4
GLOVE ECLIPSE 7.5 STRL STRAW (GLOVE) ×6 IMPLANT
GOWN STRL REUS W/TWL XL LVL3 (GOWN DISPOSABLE) ×6 IMPLANT
HANDPIECE INTERPULSE COAX TIP (DISPOSABLE) ×2
HOLDER FOLEY CATH W/STRAP (MISCELLANEOUS) IMPLANT
HOOD PEEL AWAY FLYTE STAYCOOL (MISCELLANEOUS) ×9 IMPLANT
MANIFOLD NEPTUNE II (INSTRUMENTS) ×3 IMPLANT
NEEDLE HYPO 22GX1.5 SAFETY (NEEDLE) ×3 IMPLANT
NS IRRIG 1000ML POUR BTL (IV SOLUTION) ×3 IMPLANT
PACK ICE MAXI GEL EZY WRAP (MISCELLANEOUS) ×3 IMPLANT
PACK TOTAL KNEE CUSTOM (KITS) ×3 IMPLANT
PADDING CAST COTTON 6X4 STRL (CAST SUPPLIES) ×3 IMPLANT
PIN STEINMAN FIXATION KNEE (PIN) ×3 IMPLANT
PIN THREADED HEADED SIGMA (PIN) ×3 IMPLANT
PROTECTOR NERVE ULNAR (MISCELLANEOUS) ×3 IMPLANT
SET HNDPC FAN SPRY TIP SCT (DISPOSABLE) ×1 IMPLANT
STRIP CLOSURE SKIN 1/2X4 (GAUZE/BANDAGES/DRESSINGS) IMPLANT
SUT MNCRL AB 3-0 PS2 18 (SUTURE) ×3 IMPLANT
SUT VIC AB 0 CT1 36 (SUTURE) ×3 IMPLANT
SUT VIC AB 1 CT1 36 (SUTURE) ×6 IMPLANT
SUT VIC AB 2-0 CT1 27 (SUTURE)
SUT VIC AB 2-0 CT1 TAPERPNT 27 (SUTURE) IMPLANT
SYR CONTROL 10ML LL (SYRINGE) ×6 IMPLANT
TIBIAL BASE ROT PLAT SZ 8 KNEE (Knees) ×3 IMPLANT
TRAY FOLEY MTR SLVR 16FR STAT (SET/KITS/TRAYS/PACK) ×3 IMPLANT
WATER STERILE IRR 1000ML POUR (IV SOLUTION) ×6 IMPLANT
WRAP KNEE MAXI GEL POST OP (GAUZE/BANDAGES/DRESSINGS) ×3 IMPLANT
YANKAUER SUCT BULB TIP 10FT TU (MISCELLANEOUS) ×3 IMPLANT

## 2018-12-25 NOTE — Anesthesia Postprocedure Evaluation (Signed)
Anesthesia Post Note  Patient: Wesley Brooks  Procedure(s) Performed: RIGHT TOTAL KNEE ARTHROPLASTY (Right Knee)     Patient location during evaluation: PACU Anesthesia Type: Spinal Level of consciousness: awake and alert Pain management: pain level controlled Vital Signs Assessment: post-procedure vital signs reviewed and stable Respiratory status: spontaneous breathing and respiratory function stable Cardiovascular status: blood pressure returned to baseline and stable Postop Assessment: spinal receding Anesthetic complications: no    Last Vitals:  Vitals:   12/25/18 1230 12/25/18 1245  BP: 127/74 134/78  Pulse: 63 64  Resp: (!) 9 16  Temp:    SpO2: 95% 98%    Last Pain:  Vitals:   12/25/18 1230  TempSrc:   PainSc: 5                  Kennieth Rad

## 2018-12-25 NOTE — Anesthesia Procedure Notes (Signed)
Anesthesia Regional Block: Adductor canal block   Pre-Anesthetic Checklist: ,, timeout performed, Correct Patient, Correct Site, Correct Laterality, Correct Procedure, Correct Position, site marked, Risks and benefits discussed,  Surgical consent,  Pre-op evaluation,  At surgeon's request and post-op pain management  Laterality: Right  Prep: chloraprep       Needles:  Injection technique: Single-shot  Needle Type: Echogenic Needle     Needle Length: 9cm  Needle Gauge: 21     Additional Needles:   Procedures:,,,, ultrasound used (permanent image in chart),,,,  Narrative:  Start time: 12/25/2018 8:50 AM End time: 12/25/2018 8:57 AM Injection made incrementally with aspirations every 5 mL.  Performed by: Personally  Anesthesiologist: Marcene Duos, MD

## 2018-12-25 NOTE — Transfer of Care (Signed)
Immediate Anesthesia Transfer of Care Note  Patient: Wesley Brooks  Procedure(s) Performed: RIGHT TOTAL KNEE ARTHROPLASTY (Right Knee)  Patient Location: PACU  Anesthesia Type:Spinal  Level of Consciousness: awake and alert   Airway & Oxygen Therapy: Patient Spontanous Breathing and Patient connected to face mask oxygen  Post-op Assessment: Report given to RN and Post -op Vital signs reviewed and stable  Post vital signs: Reviewed and stable  Last Vitals:  Vitals Value Taken Time  BP 127/69 12/25/2018 11:30 AM  Temp 36.9 C 12/25/2018 11:30 AM  Pulse 68 12/25/2018 11:34 AM  Resp 14 12/25/2018 11:34 AM  SpO2 100 % 12/25/2018 11:34 AM  Vitals shown include unvalidated device data.  Last Pain:  Vitals:   12/25/18 1130  TempSrc:   PainSc: 0-No pain      Patients Stated Pain Goal: 4 (12/25/18 0729)  Complications: No apparent anesthesia complications

## 2018-12-25 NOTE — Anesthesia Procedure Notes (Signed)
Date/Time: 12/25/2018 9:39 AM Performed by: Florene Route, CRNA Oxygen Delivery Method: Simple face mask

## 2018-12-25 NOTE — Anesthesia Procedure Notes (Signed)
Spinal  Patient location during procedure: OR Start time: 12/25/2018 9:39 AM End time: 12/25/2018 9:42 AM Staffing Resident/CRNA: Sharlette Dense, CRNA Performed: resident/CRNA  Preanesthetic Checklist Completed: patient identified, site marked, surgical consent, pre-op evaluation, timeout performed, IV checked, risks and benefits discussed and monitors and equipment checked Spinal Block Patient position: sitting Prep: DuraPrep Patient monitoring: heart rate, continuous pulse ox and blood pressure Approach: midline Location: L3-4 Injection technique: single-shot Needle Needle type: Sprotte  Needle gauge: 24 G Needle length: 9 cm Additional Notes Kit expiration date 09/11/2019 and lot # [id number] Clear free flow of CSF, negative heme, negative paresthesia Tolerated well and placed in supine position

## 2018-12-25 NOTE — H&P (Signed)
TOTAL KNEE ADMISSION H&P  Patient is being admitted for right total knee arthroplasty.  Subjective:  Chief Complaint:right knee pain.  HPI: Wesley Brooks, 63 y.o. male, has a history of pain and functional disability in the right knee due to arthritis and has failed non-surgical conservative treatments for greater than 12 weeks to includeNSAID's and/or analgesics, corticosteriod injections, viscosupplementation injections, weight reduction as appropriate and activity modification.  Onset of symptoms was gradual, starting 3 years ago with gradually worsening course since that time. The patient noted prior procedures on the knee to include  arthroscopy and menisectomy on the right knee(s).  Patient currently rates pain in the right knee(s) at 8 out of 10 with activity. Patient has night pain, worsening of pain with activity and weight bearing, pain that interferes with activities of daily living and pain with passive range of motion.  Patient has evidence of subchondral sclerosis, periarticular osteophytes and joint space narrowing by imaging studies. This patient has had Failure of all reasonable conservative care. There is no active infection.  Patient Active Problem List   Diagnosis Date Noted  . Primary osteoarthritis of right knee 12/25/2018   Past Medical History:  Diagnosis Date  . Ankle pain   . Depression    "secondary to pain"   . DJD (degenerative joint disease)   . Elevated cholesterol   . Hypertension   . Nerve pain   . OSA on CPAP   . Pre-diabetes    "borderline"    Past Surgical History:  Procedure Laterality Date  . arthroscopy  Right    knee x2  . ROTATOR CUFF REPAIR Bilateral   . VASECTOMY    . VASECTOMY REVERSAL      Current Facility-Administered Medications  Medication Dose Route Frequency Provider Last Rate Last Dose  . bupivacaine liposome (EXPAREL) 1.3 % injection 266 mg  20 mL Infiltration On Call to OR Danford BadWofford, Drew A, RPH      . chlorhexidine (HIBICLENS) 4  % liquid 4 application  60 mL Topical Once Jodi GeraldsGraves, Othello Sgroi, MD      . fentaNYL (SUBLIMAZE) injection 50-100 mcg  50-100 mcg Intravenous Margart SicklesUD Fitzgerald, Robert, MD   50 mcg at 12/25/18 [id number]  . lactated ringers infusion   Intravenous Continuous Marcene DuosFitzgerald, Robert, MD 50 mL/hr at 12/25/18 [phone]    . midazolam (VERSED) injection 1-2 mg  1-2 mg Intravenous Margart SicklesUD Fitzgerald, Robert, MD   2 mg at 12/25/18 [id number]   No Known Allergies  Social History   Tobacco Use  . Smoking status: Current Some Day Smoker    Years: 46.00    Types: Cigarettes  . Smokeless tobacco: Never Used  . Tobacco comment: 4 cigarettes a day   Substance Use Topics  . Alcohol use: Yes    Comment: 1-2 shots a day , 3 times week     History reviewed. No pertinent family history.   ROS ROS: I have reviewed the patient's review of systems thoroughly and there are no positive responses as relates to the HPI. Objective:  Physical Exam  Vital signs in last 24 hours: Temp:  [98 F (36.7 C)-98.5 F (36.9 C)] 98.5 F (36.9 C) (02/14 1130) Pulse Rate:  [60-69] 65 (02/14 1200) Resp:  [11-21] 13 (02/14 1200) BP: (124-177)/(66-90) 128/69 (02/14 1200) SpO2:  [96 %-100 %] 100 % (02/14 1200) Weight:  [113.4 kg] 113.4 kg (02/14 0729) Well-developed well-nourished patient in no acute distress. Alert and oriented x3 HEENT:within normal limits Cardiac: Regular rate and rhythm Pulmonary: Lungs  clear to auscultation Abdomen: Soft and nontender.  Normal active bowel sounds  Musculoskeletal: (Right knee: Painful range of motion.  Limited range of motion.  No instability.  5 degree flexion contracture.  Neurovascular intact distally. Labs: Recent Results (from the past 2160 hour(s))  ABO/Rh     Status: None   Collection Time: 12/17/18  8:25 AM  Result Value Ref Range   ABO/RH(D)      O POS Performed at [facility], [address] [place], [place] [address]   Urinalysis, Routine w reflex microscopic     Status: None    Collection Time: 12/17/18  8:36 AM  Result Value Ref Range   Color, Urine YELLOW YELLOW   APPearance CLEAR CLEAR   Specific Gravity, Urine 1.015 1.005 - 1.030   pH 6.0 5.0 - 8.0   Glucose, UA NEGATIVE NEGATIVE mg/dL   Hgb urine dipstick NEGATIVE NEGATIVE   Bilirubin Urine NEGATIVE NEGATIVE   Ketones, ur NEGATIVE NEGATIVE mg/dL   Protein, ur NEGATIVE NEGATIVE mg/dL   Nitrite NEGATIVE NEGATIVE   Leukocytes, UA NEGATIVE NEGATIVE    Comment: Performed at [facility], [address] [place], [place] [address]  APTT     Status: None   Collection Time: 12/17/18  8:36 AM  Result Value Ref Range   aPTT 33 24 - 36 seconds    Comment: Performed at [facility], [address] [place], [place] [address]  CBC WITH DIFFERENTIAL     Status: Abnormal   Collection Time: 12/17/18  8:36 AM  Result Value Ref Range   WBC 6.6 4.0 - 10.5 K/uL   RBC 4.72 4.22 - 5.81 MIL/uL   Hemoglobin 14.4 13.0 - 17.0 g/dL   HCT 91.444.2 78.239.0 - 95.652.0 %   MCV 93.6 80.0 - 100.0 fL   MCH 30.5 26.0 - 34.0 pg   MCHC 32.6 30.0 - 36.0 g/dL   RDW 21.313.6 08.611.5 - 57.815.5 %   Platelets 339 150 - 400 K/uL   nRBC 0.0 0.0 - 0.2 %   Neutrophils Relative % 48 %   Neutro Abs 3.2 1.7 - 7.7 K/uL   Lymphocytes Relative 30 %   Lymphs Abs 2.0 0.7 - 4.0 K/uL   Monocytes Relative 8 %   Monocytes Absolute 0.5 0.1 - 1.0 K/uL   Eosinophils Relative 13 %   Eosinophils Absolute 0.9 (H) 0.0 - 0.5 K/uL   Basophils Relative 1 %   Basophils Absolute 0.0 0.0 - 0.1 K/uL   Immature Granulocytes 0 %   Abs Immature Granulocytes 0.01 0.00 - 0.07 K/uL    Comment: Performed at [facility], [address] [place], [place] [address]  Comprehensive metabolic panel     Status: Abnormal   Collection Time: 12/17/18  8:36 AM  Result Value Ref Range   Sodium 137 135 - 145 mmol/L   Potassium 4.0 3.5 - 5.1 mmol/L   Chloride 100 98 - 111 mmol/L   CO2 27 22 - 32 mmol/L   Glucose, Bld 131 (H) 70 - 99  mg/dL   BUN 18 6 - 20 mg/dL   Creatinine, Ser 9.521.04 0.61 - 1.24 mg/dL   Calcium 9.7 8.9 - 84.110.3 mg/dL   Total Protein 7.6 6.5 - 8.1 g/dL   Albumin 4.2 3.5 - 5.0 g/dL   AST 324140 (H) 15 - 41 U/L   ALT 77 (H) 0 - 44 U/L   Alkaline Phosphatase 75 38 - 126 U/L  Total Bilirubin 0.4 0.3 - 1.2 mg/dL   GFR calc non Af Amer >60 >60 mL/min   GFR calc Af Amer >60 >60 mL/min   Anion gap 10 5 - 15    Comment: Performed at [facility], [address], [place], [place] [address]  Protime-INR     Status: None   Collection Time: 12/17/18  8:36 AM  Result Value Ref Range   Prothrombin Time 13.0 11.4 - 15.2 seconds   INR 0.99     Comment: Performed at [facility], [address], [place], [place] [address]  Type and screen Order type and screen if day of surgery is less than 15 days from draw of preadmission visit or order morning of surgery if day of surgery is greater than 6 days from preadmission visit.     Status: None   Collection Time: 12/17/18  8:36 AM  Result Value Ref Range   ABO/RH(D) O POS    Antibody Screen NEG    Sample Expiration 12/28/2018    Extend sample reason      NO TRANSFUSIONS OR PREGNANCY IN THE PAST 3 MONTHS Performed at [facility], [address] [place], [place] [address]   Surgical pcr screen     Status: Abnormal   Collection Time: 12/17/18  8:36 AM  Result Value Ref Range   MRSA, PCR NEGATIVE NEGATIVE   Staphylococcus aureus POSITIVE (A) NEGATIVE    Comment: (NOTE) The Xpert SA Assay (FDA approved for NASAL specimens in patients 50 years of age and older), is one component of a comprehensive surveillance program. It is not intended to diagnose infection nor to guide or monitor treatment. Performed at [facility], [address], [place], [place] [address]   Hemoglobin A1c     Status: Abnormal   Collection Time: 12/17/18  8:36 AM  Result Value Ref Range   Hgb A1c MFr Bld 6.4 (H) 4.8 -  5.6 %    Comment: (NOTE) Pre diabetes:          5.7%-6.4% Diabetes:              >6.4% Glycemic control for   <7.0% adults with diabetes    Mean Plasma Glucose 136.98 mg/dL    Comment: Performed at [facility], [address] [place], [place] [address]    Estimated body mass index is 34.38 kg/m as calculated from the following:   Height as of this encounter: 5' 11.5" (1.816 m).   Weight as of this encounter: 113.4 kg.   Imaging Review Plain radiographs demonstrate severe degenerative joint disease of the right knee(s). The overall alignment ismild valgus. The bone quality appears to be fair for age and reported activity level.      Assessment/Plan:  End stage arthritis, right knee   The patient history, physical examination, clinical judgment of the provider and imaging studies are consistent with end stage degenerative joint disease of the right knee(s) and total knee arthroplasty is deemed medically necessary. The treatment options including medical management, injection therapy arthroscopy and arthroplasty were discussed at length. The risks and benefits of total knee arthroplasty were presented and reviewed. The risks due to aseptic loosening, infection, stiffness, patella tracking problems, thromboembolic complications and other imponderables were discussed. The patient acknowledged the explanation, agreed to proceed with the plan and consent was signed. Patient is being admitted for inpatient treatment for surgery, pain control, PT, OT, prophylactic antibiotics, VTE prophylaxis, progressive ambulation  and ADL's and discharge planning. The patient is planning to be discharged home with home health services     Patient's anticipated LOS is less than 2 midnights, meeting these requirements: - Younger than 38 - Lives within 1 hour of care - Has a competent adult at home to recover with post-op recover - NO history of  - Chronic pain requiring opiods  - Diabetes  -  Coronary Artery Disease  - Heart failure  - Heart attack  - Stroke  - DVT/VTE  - Cardiac arrhythmia  - Respiratory Failure/COPD  - Renal failure  - Anemia  - Advanced Liver disease

## 2018-12-25 NOTE — Progress Notes (Signed)
AssistedDr. Rob Fitzgerald with right, ultrasound guided, adductor canal block. Side rails up, monitors on throughout procedure. See vital signs in flow sheet. Tolerated Procedure well.  

## 2018-12-25 NOTE — Brief Op Note (Signed)
12/25/2018  12:01 PM  PATIENT:  Wesley Brooks  63 y.o. male  PRE-OPERATIVE DIAGNOSIS:  DEGENERATIVE JOINT DISEASE RIGHT KNEE  POST-OPERATIVE DIAGNOSIS:  DEGENERATIVE JOINT DISEASE RIGHT KNEE  PROCEDURE:  Procedure(s): RIGHT TOTAL KNEE ARTHROPLASTY (Right)  SURGEON:  Surgeon(s) and Role:    Jodi Geralds, MD - Primary  PHYSICIAN ASSISTANT:   ASSISTANTS: jim bethune   ANESTHESIA:   spinal  EBL:  50 mL   BLOOD ADMINISTERED:none  DRAINS: none   LOCAL MEDICATIONS USED:  MARCAINE    and OTHER experel  SPECIMEN:  No Specimen  DISPOSITION OF SPECIMEN:  N/A  COUNTS:  YES  TOURNIQUET:   Total Tourniquet Time Documented: Thigh (Right) - 52 minutes Total: Thigh (Right) - 52 minutes   DICTATION: .Other Dictation: Dictation Number [id number]  PLAN OF CARE: Admit for overnight observation  PATIENT DISPOSITION:  PACU - hemodynamically stable.   Delay start of Pharmacological VTE agent (>24hrs) due to surgical blood loss or risk of bleeding: no

## 2018-12-25 NOTE — Op Note (Signed)
NAMEMAYUR, PAOLO MEDICAL RECORD [id number] ACCOUNT [id number] DATE OF BIRTH:15-Sep-1956 FACILITY: [facility] LOCATION: [facility] PHYSICIAN:Bronson Bressman L. Seleena Reimers, MD  OPERATIVE REPORT  DATE OF PROCEDURE:  12/25/2018  PREOPERATIVE DIAGNOSIS:  End-stage degenerative joint disease, right knee with severe bone-on-bone change.  POSTOPERATIVE DIAGNOSIS:  End-stage degenerative joint disease, right knee with severe bone-on-bone change.  PROCEDURE:  Right total knee replacement with an Attune system, size 8 femur, size 8 tibia, 5 mm bridging bearing, and a 41 mm all polyethylene patella.  SURGEON:  Jodi Geralds, MD  ASSISTANT:  Gus Puma PA-C, was present for the entire case and assisted by retraction, bone cuts, and closing to minimize OR time.  BRIEF HISTORY:  The patient is a 63 year old male with a long history of significant complaints of right knee pain being treated conservatively for a long period of time.  X-ray showed bone-on-bone change.  He was having night pain and light activity  pain.  He was taken to the operating room for right total knee replacement.  DESCRIPTION OF PROCEDURE:  The patient was taken to the operating room after adequate anesthesia was obtained with spinal anesthetic, the patient was placed on the operating table, right leg was prepped and draped in usual sterile fashion.  Following  this, the leg was exsanguinated, blood pressure tourniquet inflated to 300 mmHg.  Following this, a midline incision was made, subcutaneous tissue was dissected down to the extensor mechanism and a medial parapatellar arthrotomy was undertaken.   Following this, medial and lateral meniscus were removed, retropatellar fat pad, synovium on the anterior aspect of the femur, and anterior and posterior cruciates.  Following this, an intramedullary pilot hole was drilled in the femur and a 5 degree  valgus inclination cut was made with 9 mm of distal bone resected.  Following this, attention was turned  to sizing the femur.  It sizes to an 8.  Anterior and posterior cuts were made chamfers and box.  Attention was then turned to the tibia.  It was cut  perpendicular to its long axis with 3 degree posterior slope and it sized to an 8.  It was drilled and keeled.  Following this, the trial 5 was put in place.  Excellent range of motion and stability were achieved.  Patella was cut down to the level of  15 mm, with 9.5 mm of bone resected and the 41 paddle was chosen and lugs were drilled.  Trial patella was put in place.  Knee put through a range of motion, excellent stability and range of motion are achieved.  All trials were removed and the knee was  copiously and thoroughly irrigated with pulsatile lavage irrigation and suctioned dry.  Final components were then cemented into place, size 8 tibia, size 8 femur, 5 mm bridging bearing trial was placed and a 41 all poly patella was placed and held with  a clamp.  All excess bone cement was removed.  Cement allowed to completely harden.  When the cement was completely hardened, the tourniquet was let down, all bleeders controlled with electrocautery.  All excess bone cement was removed and the final poly  was opened and placed a size 5, and excellent range of motion and stability were achieved.  Medial parapatellar arthrotomy was then closed with #1 Vicryl running, skin with 0 and 2-0 Vicryl and 3-0 Monocryl subcuticular.  Benzoin, Steri-Strips, dry  sterile compressive dressing was applied.  The patient was taken to recovery was noted to be in satisfactory condition.  Estimated blood  loss for procedure was minimal.  TN/NUANCE  D:12/25/2018 T:12/25/2018 JOB:[id number]

## 2018-12-25 NOTE — Discharge Instructions (Signed)

## 2018-12-25 NOTE — Evaluation (Signed)
Physical Therapy Evaluation Patient Details Name: Wesley Brooks MRN: [id number] DOB: [birth year] Today's Date: 12/25/2018   History of Present Illness  63 yo male s/p R TKR on 12/25/18. PMH includes OA, depression, HTN, OSA on cpap, pre-DM, R knee arthroscopy, RTC repair bilat.   Clinical Impression   Pt presents with R knee pain, decreased R knee ROM, increased time and effort to perform mobility tasks, and decreased tolerance for activity due to knee pain. Pt to benefit from acute PT to address deficits. Pt ambulated 80 ft with RW with min guard assist, verbal cuing for sequencing and form. Pt educated on ankle pumps (20/hour) to perform this afternoon/evening to increase circulation, to pt's tolerance and limited by pain. PT to progress mobility as tolerated, and will continue to follow acutely.        Follow Up Recommendations Follow surgeon's recommendation for DC plan and follow-up therapies;Supervision for mobility/OOB    Equipment Recommendations  3in1 (PT);Rolling walker with 5" wheels    Recommendations for Other Services       Precautions / Restrictions Precautions Precautions: Fall Restrictions Weight Bearing Restrictions: No Other Position/Activity Restrictions: WBAT       Mobility  Bed Mobility Overal bed mobility: Needs Assistance Bed Mobility: Supine to Sit     Supine to sit: Min guard;HOB elevated     General bed mobility comments: Min guard for safety. Verbal cuing for sequencing.   Transfers Overall transfer level: Needs assistance Equipment used: Rolling walker (2 wheeled) Transfers: Sit to/from Stand Sit to Stand: Min guard;From elevated surface         General transfer comment: Min guard for safety. Verbal cuing for hand placement. Pt weightshifting L and R without difficulty.  Ambulation/Gait Ambulation/Gait assistance: Min guard;+2 safety/equipment Gait Distance (Feet): 80 Feet Assistive device: Rolling walker (2 wheeled) Gait  Pattern/deviations: Step-to pattern;Decreased stance time - right;Decreased weight shift to right;Antalgic;Trunk flexed     General Gait Details: Min guard for safety, close guard of RLE to prevent buckling if needed but none present. Verbal cuing for sequencing, placement in walker, turning, and upright posture.   Stairs            Wheelchair Mobility    Modified Rankin (Stroke Patients Only)       Balance Overall balance assessment: Mild deficits observed, not formally tested                                           Pertinent Vitals/Pain Pain Assessment: 0-10 Pain Score: 6  Pain Location: R knee  Pain Descriptors / Indicators: Sore;Burning Pain Intervention(s): Limited activity within patient's tolerance;Repositioned;Ice applied;Monitored during session;Premedicated before session    Home Living Family/patient expects to be discharged to:: Private residence Living Arrangements: Spouse/significant other Available Help at Discharge: Family;Available 24 hours/day Type of Home: House Home Access: Stairs to enter Entrance Stairs-Rails: None Entrance Stairs-Number of Steps: 2 Home Layout: One level(with one step inside ) Home Equipment: Cane - single point      Prior Function Level of Independence: Independent         Comments: Pt reports he should've been using a cane for mobility      Hand Dominance   Dominant Hand: Right    Extremity/Trunk Assessment   Upper Extremity Assessment Upper Extremity Assessment: Overall WFL for tasks assessed    Lower Extremity Assessment Lower Extremity Assessment: Overall WFL for  tasks assessed;RLE deficits/detail RLE Deficits / Details: suspected post-surgical weakness; able to perform ankle pumps, quad set, SLR without lift assist and <10* quad lag, heel slide to 70* limited by pain  RLE Sensation: WNL    Cervical / Trunk Assessment Cervical / Trunk Assessment: Normal  Communication    Communication: No difficulties  Cognition Arousal/Alertness: Awake/alert Behavior During Therapy: WFL for tasks assessed/performed Overall Cognitive Status: Within Functional Limits for tasks assessed                                        General Comments      Exercises Total Joint Exercises Goniometric ROM: knee aarom ~10-70*, limited by pain    Assessment/Plan    PT Assessment Patient needs continued PT services  PT Problem List Decreased strength;Pain;Decreased range of motion;Decreased activity tolerance;Decreased knowledge of use of DME;Decreased balance;Decreased mobility       PT Treatment Interventions DME instruction;Therapeutic activities;Gait training;Therapeutic exercise;Patient/family education;Stair training;Functional mobility training;Balance training    PT Goals (Current goals can be found in the Care Plan section)  Acute Rehab PT Goals Patient Stated Goal: decrease R knee pain, walk better PT Goal Formulation: With patient Time For Goal Achievement: 01/01/19 Potential to Achieve Goals: Good    Frequency 7X/week   Barriers to discharge        Co-evaluation               AM-PAC PT "6 Clicks" Mobility  Outcome Measure Help needed turning from your back to your side while in a flat bed without using bedrails?: A Little Help needed moving from lying on your back to sitting on the side of a flat bed without using bedrails?: A Little Help needed moving to and from a bed to a chair (including a wheelchair)?: A Little Help needed standing up from a chair using your arms (e.g., wheelchair or bedside chair)?: A Little Help needed to walk in hospital room?: A Little Help needed climbing 3-5 steps with a railing? : A Little 6 Click Score: 18    End of Session Equipment Utilized During Treatment: Gait belt Activity Tolerance: Patient tolerated treatment well;Patient limited by pain Patient left: in chair;with chair alarm set;with call  bell/phone within reach;with family/visitor present;with SCD's reapplied Nurse Communication: Mobility status PT Visit Diagnosis: Other abnormalities of gait and mobility (R26.89);Difficulty in walking, not elsewhere classified (R26.2)    Time: 2334-3568 PT Time Calculation (min) (ACUTE ONLY): 25 min   Charges:   PT Evaluation $PT Eval Low Complexity: 1 Low PT Treatments $Gait Training: 8-22 mins        Wesley Brooks, PT Acute Rehabilitation Services Pager [phone]  Office [phone]   Wesley Brooks 12/25/2018, 6:53 PM

## 2018-12-26 DIAGNOSIS — M1711 Unilateral primary osteoarthritis, right knee: Secondary | ICD-10-CM | POA: Diagnosis not present

## 2018-12-26 LAB — CBC
HCT: 39.4 % (ref 39.0–52.0)
Hemoglobin: 12.6 g/dL — ABNORMAL LOW (ref 13.0–17.0)
MCH: 29.5 pg (ref 26.0–34.0)
MCHC: 32 g/dL (ref 30.0–36.0)
MCV: 92.3 fL (ref 80.0–100.0)
Platelets: 348 10*3/uL (ref 150–400)
RBC: 4.27 MIL/uL (ref 4.22–5.81)
RDW: 12.9 % (ref 11.5–15.5)
WBC: 17.6 10*3/uL — ABNORMAL HIGH (ref 4.0–10.5)
nRBC: 0 % (ref 0.0–0.2)

## 2018-12-26 NOTE — Discharge Summary (Signed)
Patient ID: Wesley Brooks MRN: [id number] DOB/AGE: 61-07-1956 61 y.o.  Admit date: 12/25/2018 Discharge date: 12/26/2018  Admission Diagnoses:  Principal Problem:   Primary osteoarthritis of right knee   Discharge Diagnoses:  Same  Past Medical History:  Diagnosis Date  . Ankle pain   . Depression    "secondary to pain"   . DJD (degenerative joint disease)   . Elevated cholesterol   . Hypertension   . Nerve pain   . OSA on CPAP   . Pre-diabetes    "borderline"    Surgeries: Procedure(s): RIGHT TOTAL KNEE ARTHROPLASTY on 12/25/2018   Consultants:   Discharged Condition: Improved  Hospital Course: Wesley Brooks is an 63 y.o. male who was admitted 12/25/2018 for operative treatment ofPrimary osteoarthritis of right knee. Patient has severe unremitting pain that affects sleep, daily activities, and work/hobbies. After pre-op clearance the patient was taken to the operating room on 12/25/2018 and underwent  Procedure(s): RIGHT TOTAL KNEE ARTHROPLASTY.    Patient was given perioperative antibiotics:  Anti-infectives (From admission, onward)   Start     Dose/Rate Route Frequency Ordered Stop   12/25/18 1600  ceFAZolin (ANCEF) IVPB 2g/100 mL premix     2 g 200 mL/hr over 30 Minutes Intravenous Every 6 hours 12/25/18 1406 12/25/18 2209   12/25/18 0715  ceFAZolin (ANCEF) IVPB 2g/100 mL premix     2 g 200 mL/hr over 30 Minutes Intravenous On call to O.R. 12/25/18 7628 12/25/18 0953       Patient was given sequential compression devices, early ambulation, and chemoprophylaxis to prevent DVT.  Patient benefited maximally from hospital stay and there were no complications.    Recent vital signs:  Patient Vitals for the past 24 hrs:  BP Temp Temp src Pulse Resp SpO2  12/26/18 0611 131/82 (!) 97.5 F (36.4 C) Oral (!) 59 16 98 %  12/25/18 2202 (!) 167/84 97.7 F (36.5 C) Oral 83 16 98 %  12/25/18 1748 (!) 161/86 98 F (36.7 C) - 79 16 97 %  12/25/18 1630 (!) 153/78 97.8 F  (36.6 C) Oral 78 16 97 %  12/25/18 1454 (!) 150/83 97.7 F (36.5 C) Oral 67 16 96 %  12/25/18 1408 (!) 145/85 97.7 F (36.5 C) Oral 64 16 -  12/25/18 1358 (!) 145/85 97.7 F (36.5 C) - 69 15 93 %  12/25/18 1345 (!) 149/76 - - 69 14 98 %  12/25/18 1330 (!) 141/89 - - 70 20 98 %  12/25/18 1315 (!) 150/78 - - 65 (!) 7 95 %  12/25/18 1300 131/85 - - 70 20 99 %  12/25/18 1245 134/78 - - 64 16 98 %  12/25/18 1230 127/74 - - 63 (!) 9 95 %  12/25/18 1215 121/77 - - 73 18 99 %  12/25/18 1200 128/69 - - 65 13 100 %  12/25/18 1145 124/66 - - 67 11 100 %  12/25/18 1130 127/69 98.5 F (36.9 C) - - 16 100 %  12/25/18 0905 127/78 - - 65 16 99 %  12/25/18 0900 136/75 - - 63 19 98 %     Recent laboratory studies:  Recent Labs    12/26/18 0448  WBC 17.6*  HGB 12.6*  HCT 39.4  PLT 348     Discharge Medications:   Allergies as of 12/26/2018   No Known Allergies     Medication List    STOP taking these medications   meloxicam 7.5 MG tablet Commonly known as:  MOBIC  TAKE these medications   allopurinol 100 MG tablet Commonly known as:  ZYLOPRIM Take 100 mg by mouth daily.   amLODipine 10 MG tablet Commonly known as:  NORVASC Take 10 mg by mouth daily.   aspirin EC 325 MG tablet Take 1 tablet (325 mg total) by mouth 2 (two) times daily after a meal. Take x 1 month post op to decrease risk of blood clots.   cholecalciferol 25 MCG (1000 UT) tablet Commonly known as:  VITAMIN D3 Take 1,000 Units by mouth daily.   docusate sodium 100 MG capsule Commonly known as:  COLACE Take 1 capsule (100 mg total) by mouth 2 (two) times daily.   gemfibrozil 600 MG tablet Commonly known as:  LOPID Take 600 mg by mouth daily.   hydrochlorothiazide 25 MG tablet Commonly known as:  HYDRODIURIL Take 25 mg by mouth daily.   losartan 100 MG tablet Commonly known as:  COZAAR Take 100 mg by mouth daily.   metoprolol tartrate 50 MG tablet Commonly known as:  LOPRESSOR Take 50 mg by  mouth 2 (two) times daily.   multivitamin with minerals tablet Take 1 tablet by mouth daily.   Omega-3 1000 MG Caps Take 1 capsule by mouth 2 (two) times daily.   oxyCODONE-acetaminophen 5-325 MG tablet Commonly known as:  PERCOCET/ROXICET Take 1-2 tablets by mouth every 6 (six) hours as needed for severe pain.   rosuvastatin 20 MG tablet Commonly known as:  CRESTOR Take 10 mg by mouth daily.   tiZANidine 2 MG tablet Commonly known as:  ZANAFLEX Take 1 tablet (2 mg total) by mouth every 8 (eight) hours as needed for muscle spasms.            Discharge Care Instructions  (From admission, onward)         Start     Ordered   12/26/18 0000  Weight bearing as tolerated    Question Answer Comment  Laterality right   Extremity Lower      12/26/18 0856          Diagnostic Studies: Dg Chest 2 View  Result Date: 12/17/2018 CLINICAL DATA:  Cold symptoms. EXAM: CHEST - 2 VIEW COMPARISON:  None. FINDINGS: Cardiomediastinal silhouette is normal. Mediastinal contours appear intact. There is no evidence of focal airspace consolidation, pleural effusion or pneumothorax. Osseous structures are without acute abnormality. Soft tissues are grossly normal. IMPRESSION: No active cardiopulmonary disease. Electronically Signed   By: Ted Mcalpineobrinka  Dimitrova M.D.   On: 12/17/2018 12:24    Disposition: Discharge disposition: 01-Home or Self Care       Discharge Instructions    CPM   Complete by:  As directed    Continuous passive motion machine (CPM):      Use the CPM from 0 to 70 for 8 hours per day.      You may increase by 5-10 per day.  You may break it up into 2 or 3 sessions per day.      Use CPM for 1-2 weeks or until you are told to stop.   Call MD / Call 911   Complete by:  As directed    If you experience chest pain or shortness of breath, CALL 911 and be transported to the hospital emergency room.  If you develope a fever above 101 F, pus (white drainage) or increased  drainage or redness at the wound, or calf pain, call your surgeon's office.   Constipation Prevention   Complete by:  As directed  Drink plenty of fluids.  Prune juice may be helpful.  You may use a stool softener, such as Colace (over the counter) 100 mg twice a day.  Use MiraLax (over the counter) for constipation as needed.   Diet general   Complete by:  As directed    Do not put a pillow under the knee. Place it under the heel.   Complete by:  As directed    Increase activity slowly as tolerated   Complete by:  As directed    Weight bearing as tolerated   Complete by:  As directed    Laterality:  right   Extremity:  Lower      Follow-up Information    Jodi Geralds, MD. Schedule an appointment as soon as possible for a visit in 2 weeks.   Specialty:  Orthopedic Surgery Contact information: [address] [place] [address] [phone]            Signed: Matthew Folks 12/26/2018, 8:56 AM

## 2018-12-26 NOTE — Progress Notes (Signed)
Physical Therapy Treatment Patient Details Name: Wesley Brooks MRN: [id number] DOB: [birth year] Today's Date: 12/26/2018    History of Present Illness 63 yo male s/p R TKR on 12/25/18. PMH includes OA, depression, HTN, OSA on cpap, pre-DM, R knee arthroscopy, RTC repair bilat.     PT Comments    POD 3 1 am session Assisted with amb a greater distance, practiced stairs three times,  assisted back to bed Demonstrated and instructed pt how to use belt to self assist LE as a leg lifter.  Then returned to room to perform some TE's following HEP handout.  Instructed on proper tech, freq as well as use of ICE.  Addressed all mobility questions, discussed appropriate activity, educated on use of ICE.  Pt ready for D/C to home.     Follow Up Recommendations  Follow surgeon's recommendation for DC plan and follow-up therapies;Supervision for mobility/OOB     Equipment Recommendations  3in1 (PT);Rolling walker with 5" wheels    Recommendations for Other Services       Precautions / Restrictions Precautions Precautions: Fall Restrictions Weight Bearing Restrictions: No Other Position/Activity Restrictions: WBAT     Mobility  Bed Mobility Overal bed mobility: Needs Assistance Bed Mobility: Supine to Sit;Sit to Supine     Supine to sit: Min guard Sit to supine: Min guard   General bed mobility comments: demonstarted and instructed how to use a belt to self assist LE OOB and onto bed   Transfers Overall transfer level: Needs assistance Equipment used: Rolling walker (2 wheeled) Transfers: Sit to/from Stand Sit to Stand: Min guard;From elevated surface;Supervision         General transfer comment: 25% VC's on safety with turns and LE advancement   Ambulation/Gait Ambulation/Gait assistance: Supervision;Min guard Gait Distance (Feet): 95 Feet Assistive device: Rolling walker (2 wheeled) Gait Pattern/deviations: Step-to pattern;Decreased stance time - right;Decreased weight shift  to right Gait velocity: decreased    General Gait Details: 25% VC's on proper walker to self distance and safety with turns    Stairs Stairs: Yes Stairs assistance: Min assist Stair Management: No rails;Step to pattern;Forwards;With walker Number of Stairs: 2 General stair comments: with spouse "hands on" assisted with instruction on proper tech and safety.  Performed three times.     Wheelchair Mobility    Modified Rankin (Stroke Patients Only)       Balance                                            Cognition Arousal/Alertness: Awake/alert Behavior During Therapy: WFL for tasks assessed/performed Overall Cognitive Status: Within Functional Limits for tasks assessed                                        Exercises   Total Knee Replacement TE's 10 reps B LE ankle pumps 10 reps towel squeezes 10 reps knee presses 10 reps heel slides  10 reps SAQ's 10 reps SLR's 10 reps ABD Followed by ICE    General Comments        Pertinent Vitals/Pain Pain Assessment: 0-10 Pain Score: 3  Pain Location: R knee  Pain Descriptors / Indicators: Sore;Burning Pain Intervention(s): Monitored during session;Repositioned    Home Living  Prior Function            PT Goals (current goals can now be found in the care plan section) Progress towards PT goals: Progressing toward goals    Frequency    7X/week      PT Plan Current plan remains appropriate    Co-evaluation              AM-PAC PT "6 Clicks" Mobility   Outcome Measure  Help needed turning from your back to your side while in a flat bed without using bedrails?: A Little Help needed moving from lying on your back to sitting on the side of a flat bed without using bedrails?: A Little Help needed moving to and from a bed to a chair (including a wheelchair)?: A Little Help needed standing up from a chair using your arms (e.g., wheelchair or  bedside chair)?: A Little Help needed to walk in hospital room?: A Little Help needed climbing 3-5 steps with a railing? : A Little 6 Click Score: 18    End of Session Equipment Utilized During Treatment: Gait belt Activity Tolerance: Patient tolerated treatment well;Patient limited by pain Patient left: in chair;with chair alarm set;with call bell/phone within reach;with family/visitor present;with SCD's reapplied Nurse Communication: (pt ready for D/C to home) PT Visit Diagnosis: Other abnormalities of gait and mobility (R26.89);Difficulty in walking, not elsewhere classified (R26.2)     Time: 0930-1010 PT Time Calculation (min) (ACUTE ONLY): 40 min  Charges:  $Gait Training: 8-22 mins $Therapeutic Exercise: 8-22 mins $Therapeutic Activity: 8-22 mins                     Felecia Shelling  PTA Acute  Rehabilitation Services Pager      [phone] Office      [phone]

## 2018-12-26 NOTE — Progress Notes (Signed)
Subjective: 1 Day Post-Op Procedure(s) (LRB): RIGHT TOTAL KNEE ARTHROPLASTY (Right) Patient reports pain as 3 on 0-10 scale and moderate.  Taking by mouth and voiding okay.  Progressing with physical therapy.  Wants to go home today.  Objective: Vital signs in last 24 hours: Temp:  [97.5 F (36.4 C)-98.5 F (36.9 C)] 97.5 F (36.4 C) (02/15 0611) Pulse Rate:  [59-83] 59 (02/15 0611) Resp:  [7-20] 16 (02/15 0611) BP: (121-167)/(66-89) 131/82 (02/15 0611) SpO2:  [93 %-100 %] 98 % (02/15 0611)  Intake/Output from previous day: 02/14 0701 - 02/15 0700 In: 3170 [P.O.:480; I.V.:2221.6; IV Piggyback:468.3] Out: 2400 [Urine:2350; Blood:50] Intake/Output this shift: No intake/output data recorded.  Recent Labs    12/26/18 0448  HGB 12.6*   Recent Labs    12/26/18 0448  WBC 17.6*  RBC 4.27  HCT 39.4  PLT 348   No results for input(s): NA, K, CL, CO2, BUN, CREATININE, GLUCOSE, CALCIUM in the last 72 hours. No results for input(s): LABPT, INR in the last 72 hours. Right knee exam: Neurovascular intact Sensation intact distally Intact pulses distally Dorsiflexion/Plantar flexion intact Incision: dressing C/D/I Compartment soft   Assessment/Plan: 1 Day Post-Op Procedure(s) (LRB): RIGHT TOTAL KNEE ARTHROPLASTY (Right) Plan: Aspirin 325 mg twice daily for DVT prophylaxis.  Treat x1 month. Up with therapy Discharge home with home health Follow-up with Dr. Luiz Blare in 2 weeks.    Patient's anticipated LOS is less than 2 midnights, meeting these requirements: - Younger than 86 - Lives within 1 hour of care - Has a competent adult at home to recover with post-op recover - NO history of  - Chronic pain requiring opiods  - Diabetes  - Coronary Artery Disease  - Heart failure  - Heart attack  - Stroke  - DVT/VTE  - Cardiac arrhythmia  - Respiratory Failure/COPD  - Renal failure  - Anemia  - Advanced Liver disease       Matthew Folks 12/26/2018, 8:53 AM

## 2018-12-28 ENCOUNTER — Encounter (HOSPITAL_COMMUNITY): Payer: Self-pay | Admitting: Orthopedic Surgery

## 2020-10-23 ENCOUNTER — Encounter (HOSPITAL_COMMUNITY): Payer: Self-pay

## 2020-10-24 ENCOUNTER — Encounter (HOSPITAL_COMMUNITY): Payer: Self-pay

## 2020-10-24 ENCOUNTER — Other Ambulatory Visit (HOSPITAL_COMMUNITY): Payer: Self-pay

## 2020-11-02 ENCOUNTER — Other Ambulatory Visit (HOSPITAL_COMMUNITY): Payer: Self-pay

## 2020-11-06 ENCOUNTER — Encounter (HOSPITAL_COMMUNITY)
Admission: RE | Admit: 2020-11-06 | Discharge: 2020-11-06 | Disposition: A | Discharge status: Home / Self Care | Payer: BC Managed Care – PPO | Source: Ambulatory Visit | Attending: Orthopedic Surgery | Admitting: Orthopedic Surgery

## 2020-11-06 ENCOUNTER — Encounter (HOSPITAL_COMMUNITY): Payer: Self-pay

## 2020-11-06 ENCOUNTER — Other Ambulatory Visit: Payer: Self-pay

## 2020-11-06 ENCOUNTER — Other Ambulatory Visit (HOSPITAL_COMMUNITY)
Admission: RE | Admit: 2020-11-06 | Discharge: 2020-11-06 | Disposition: A | Discharge status: Home / Self Care | Payer: BC Managed Care – PPO | Source: Ambulatory Visit | Attending: Orthopedic Surgery | Admitting: Orthopedic Surgery

## 2020-11-06 DIAGNOSIS — Z01818 Encounter for other preprocedural examination: Secondary | ICD-10-CM | POA: Insufficient documentation

## 2020-11-06 DIAGNOSIS — Z20822 Contact with and (suspected) exposure to covid-19: Secondary | ICD-10-CM | POA: Insufficient documentation

## 2020-11-06 HISTORY — DX: Type 2 diabetes mellitus without complications: E11.9

## 2020-11-06 LAB — CBC
HCT: 44 % (ref 39.0–52.0)
Hemoglobin: 14.9 g/dL (ref 13.0–17.0)
MCH: 31.8 pg (ref 26.0–34.0)
MCHC: 33.9 g/dL (ref 30.0–36.0)
MCV: 93.8 fL (ref 80.0–100.0)
Platelets: 469 10*3/uL — ABNORMAL HIGH (ref 150–400)
RBC: 4.69 MIL/uL (ref 4.22–5.81)
RDW: 12.8 % (ref 11.5–15.5)
WBC: 11.7 10*3/uL — ABNORMAL HIGH (ref 4.0–10.5)
nRBC: 0 % (ref 0.0–0.2)

## 2020-11-06 LAB — BASIC METABOLIC PANEL
Anion gap: 14 (ref 5–15)
BUN: 25 mg/dL — ABNORMAL HIGH (ref 8–23)
CO2: 27 mmol/L (ref 22–32)
Calcium: 10.7 mg/dL — ABNORMAL HIGH (ref 8.9–10.3)
Chloride: 97 mmol/L — ABNORMAL LOW (ref 98–111)
Creatinine, Ser: 1.43 mg/dL — ABNORMAL HIGH (ref 0.61–1.24)
GFR, Estimated: 55 mL/min — ABNORMAL LOW (ref >60)
Glucose, Bld: 140 mg/dL — ABNORMAL HIGH (ref 70–99)
Potassium: 4.1 mmol/L (ref 3.5–5.1)
Sodium: 138 mmol/L (ref 135–145)

## 2020-11-06 LAB — SURGICAL PCR SCREEN
MRSA, PCR: NEGATIVE
Staphylococcus aureus: POSITIVE — AB

## 2020-11-06 LAB — GLUCOSE, CAPILLARY: Glucose-Capillary: 154 mg/dL — ABNORMAL HIGH (ref 70–99)

## 2020-11-06 LAB — SARS CORONAVIRUS 2 (TAT 6-24 HRS): SARS Coronavirus 2: NEGATIVE

## 2020-11-06 NOTE — Progress Notes (Signed)
DUE TO COVID-19 ONLY ONE VISITOR IS ALLOWED TO COME WITH YOU AND STAY IN THE WAITING ROOM ONLY DURING PRE OP AND PROCEDURE DAY OF SURGERY. THE 1 VISITOR  MAY VISIT WITH YOU AFTER SURGERY IN YOUR PRIVATE ROOM DURING VISITING HOURS ONLY!  YOU NEED TO HAVE A COVID 19 TEST ON_12/27/2021 ______ @_______ , THIS TEST MUST BE DONE BEFORE SURGERY,  COVID TESTING SITE [address] [place] , IT IS ON THE RIGHT GOING OUT [address] APPROXIMATELY  2 MINUTES PAST [employer] ON THE RIGHT. ONCE YOUR COVID TEST IS COMPLETED,  PLEASE BEGIN THE QUARANTINE INSTRUCTIONS AS OUTLINED IN YOUR HANDOUT.                Wesley Brooks  11/06/2020   Your procedure is scheduled on: 10/12/2020    Report to [facility] Main  Entrance   Report to admitting at    1045 AM     Call this number if you have problems the morning of surgery [phone]    Remember: Do not eat food , candy gum or mints :After Midnight. You may have clear liquids from midnight until 1015AM     CLEAR LIQUID DIET   Foods Allowed                                                                       Coffee and tea, regular and decaf                              Plain Jell-O any favor except red or purple                                            Fruit ices (not with fruit pulp)                                      Iced Popsicles                                     Carbonated beverages, regular and diet                                    Cranberry, grape and apple juices Sports drinks like Gatorade Lightly seasoned clear broth or consume(fat free) Sugar, honey syrup   _____________________________________________________________________    BRUSH YOUR TEETH MORNING OF SURGERY AND RINSE YOUR MOUTH OUT, NO CHEWING GUM CANDY OR MINTS.     Take these medicines the morning of surgery with A SIP OF WATER: AMLODIPINE, METOPROLOL   DO NOT TAKE ANY DIABETIC MEDICATIONS DAY OF YOUR SURGERY                                You may not have any metal on your  body including hair pins and              piercings  Do not wear jewelry, make-up, lotions, powders or perfumes, deodorant             Do not wear nail polish on your fingernails.  Do not shave  48 hours prior to surgery.              Men may shave face and neck.   Do not bring valuables to the hospital. [place].  Contacts, dentures or bridgework may not be worn into surgery.  Leave suitcase in the car. After surgery it may be brought to your room.     Patients discharged the day of surgery will not be allowed to drive home. IF YOU ARE HAVING SURGERY AND GOING HOME THE SAME DAY, YOU MUST HAVE AN ADULT TO DRIVE YOU HOME AND BE WITH YOU FOR 24 HOURS. YOU MAY GO HOME BY TAXI OR UBER OR ORTHERWISE, BUT AN ADULT MUST ACCOMPANY YOU HOME AND STAY WITH YOU FOR 24 HOURS.  Name and phone number of your driver:  Special Instructions: N/A              Please read over the following fact sheets you were given: _____________________________________________________________________  [facility] - Preparing for Surgery Before surgery, you can play an important role.  Because skin is not sterile, your skin needs to be as free of germs as possible.  You can reduce the number of germs on your skin by washing with CHG (chlorahexidine gluconate) soap before surgery.  CHG is an antiseptic cleaner which kills germs and bonds with the skin to continue killing germs even after washing. Please DO NOT use if you have an allergy to CHG or antibacterial soaps.  If your skin becomes reddened/irritated stop using the CHG and inform your nurse when you arrive at Short Stay. Do not shave (including legs and underarms) for at least 48 hours prior to the first CHG shower.  You may shave your face/neck. Please follow these instructions carefully:  1.  Shower with CHG Soap the night before surgery and the  morning of  Surgery.  2.  If you choose to wash your hair, wash your hair first as usual with your  normal  shampoo.  3.  After you shampoo, rinse your hair and body thoroughly to remove the  shampoo.                           4.  Use CHG as you would any other liquid soap.  You can apply chg directly  to the skin and wash                       Gently with a scrungie or clean washcloth.  5.  Apply the CHG Soap to your body ONLY FROM THE NECK DOWN.   Do not use on face/ open                           Wound or open sores. Avoid contact with eyes, ears mouth and genitals (private parts).                       Wash face,  Development worker, international aid (private  parts) with your normal soap.             6.  Wash thoroughly, paying special attention to the area where your surgery  will be performed.  7.  Thoroughly rinse your body with warm water from the neck down.  8.  DO NOT shower/wash with your normal soap after using and rinsing off  the CHG Soap.                9.  Pat yourself dry with a clean towel.            10.  Wear clean pajamas.            11.  Place clean sheets on your bed the night of your first shower and do not  sleep with pets. Day of Surgery : Do not apply any lotions/deodorants the morning of surgery.  Please wear clean clothes to the hospital/surgery center.  FAILURE TO FOLLOW THESE INSTRUCTIONS MAY RESULT IN THE CANCELLATION OF YOUR SURGERY PATIENT SIGNATURE_________________________________  NURSE SIGNATURE__________________________________  ________________________________________________________________________

## 2020-11-06 NOTE — Progress Notes (Addendum)
Anesthesia Review:- - FGB:MSXJD Britt,PAC - LOV for clearance on 11/01/2020 Office not open on 11/06/20 will call on 12/28 for clearance and note and ekg [phone]  Called and LVMM on 11/07/2020 requesting 12 lead ekg tracing within year.   Cardiologist : Chest x-ray : EKG : done 11/01/2020 per pt  Echo : Stress test: Cardiac Cath :  Activity level:  Sleep Study/ CPAP : Fasting Blood Sugar :      / Checks Blood Sugar -- times a day:   Blood Thinner/ Instructions /Last Dose: ASA / Instructions/ Last Dose :  Checks glucose every 3 days at home  Pt reports having covid test both rapid and pcr on 12/20 due to cough at [facility] and pt reports being covid negative for both.   Last dose for 81 mg Aspirin was on 11/05/2020 per pt  CBC and BMP done 12/27 routed to Dr Luiz Blare Pt reported having cough on 10/30/2020.  Covid negative test done on 12/20/201 at [facility] in [place] per pt .  Pt denies any symptoms on today preop visit of 11/06/2020.

## 2020-11-07 ENCOUNTER — Other Ambulatory Visit: Payer: Self-pay | Admitting: Orthopedic Surgery

## 2020-11-07 DIAGNOSIS — Z01811 Encounter for preprocedural respiratory examination: Secondary | ICD-10-CM

## 2020-11-07 LAB — HEMOGLOBIN A1C
Hgb A1c MFr Bld: 6.7 % — ABNORMAL HIGH (ref 4.8–5.6)
Mean Plasma Glucose: 145.59 mg/dL

## 2020-11-08 ENCOUNTER — Encounter (HOSPITAL_COMMUNITY): Payer: Self-pay | Admitting: Orthopedic Surgery

## 2020-11-08 ENCOUNTER — Ambulatory Visit (HOSPITAL_COMMUNITY): Payer: BC Managed Care – PPO | Admitting: Physician Assistant

## 2020-11-08 ENCOUNTER — Encounter (HOSPITAL_COMMUNITY)
Admission: RE | Disposition: A | Discharge status: Home / Self Care | Payer: Self-pay | Source: Home / Self Care | Attending: Orthopedic Surgery

## 2020-11-08 ENCOUNTER — Other Ambulatory Visit: Payer: Self-pay

## 2020-11-08 ENCOUNTER — Ambulatory Visit (HOSPITAL_COMMUNITY): Payer: BC Managed Care – PPO

## 2020-11-08 ENCOUNTER — Ambulatory Visit (HOSPITAL_COMMUNITY)
Admission: RE | Admit: 2020-11-08 | Discharge: 2020-11-08 | Disposition: A | Discharge status: Home / Self Care | Payer: BC Managed Care – PPO | Attending: Orthopedic Surgery | Admitting: Orthopedic Surgery

## 2020-11-08 DIAGNOSIS — T84033A Mechanical loosening of internal left knee prosthetic joint, initial encounter: Secondary | ICD-10-CM | POA: Diagnosis present

## 2020-11-08 DIAGNOSIS — Z96652 Presence of left artificial knee joint: Secondary | ICD-10-CM

## 2020-11-08 DIAGNOSIS — Y792 Prosthetic and other implants, materials and accessory orthopedic devices associated with adverse incidents: Secondary | ICD-10-CM | POA: Diagnosis not present

## 2020-11-08 DIAGNOSIS — Z96653 Presence of artificial knee joint, bilateral: Secondary | ICD-10-CM | POA: Diagnosis not present

## 2020-11-08 DIAGNOSIS — Z01811 Encounter for preprocedural respiratory examination: Secondary | ICD-10-CM

## 2020-11-08 HISTORY — PX: TOTAL KNEE REVISION: SHX996

## 2020-11-08 LAB — COMPREHENSIVE METABOLIC PANEL
ALT: 76 U/L — ABNORMAL HIGH (ref 0–44)
AST: 171 U/L — ABNORMAL HIGH (ref 15–41)
Albumin: 4.4 g/dL (ref 3.5–5.0)
Alkaline Phosphatase: 111 U/L (ref 38–126)
Anion gap: 13 (ref 5–15)
BUN: 25 mg/dL — ABNORMAL HIGH (ref 8–23)
CO2: 25 mmol/L (ref 22–32)
Calcium: 10.4 mg/dL — ABNORMAL HIGH (ref 8.9–10.3)
Chloride: 99 mmol/L (ref 98–111)
Creatinine, Ser: 1.38 mg/dL — ABNORMAL HIGH (ref 0.61–1.24)
GFR, Estimated: 58 mL/min — ABNORMAL LOW (ref >60)
Glucose, Bld: 137 mg/dL — ABNORMAL HIGH (ref 70–99)
Potassium: 4.4 mmol/L (ref 3.5–5.1)
Sodium: 137 mmol/L (ref 135–145)
Total Bilirubin: 0.8 mg/dL (ref 0.3–1.2)
Total Protein: 8.4 g/dL — ABNORMAL HIGH (ref 6.5–8.1)

## 2020-11-08 LAB — TYPE AND SCREEN
ABO/RH(D): O POS
Antibody Screen: NEGATIVE

## 2020-11-08 LAB — GLUCOSE, CAPILLARY
Glucose-Capillary: 143 mg/dL — ABNORMAL HIGH (ref 70–99)
Glucose-Capillary: 145 mg/dL — ABNORMAL HIGH (ref 70–99)

## 2020-11-08 LAB — APTT: aPTT: 34 seconds (ref 24–36)

## 2020-11-08 LAB — PROTIME-INR
INR: 1.1 (ref 0.8–1.2)
Prothrombin Time: 13.8 seconds (ref 11.4–15.2)

## 2020-11-08 SURGERY — TOTAL KNEE REVISION
Anesthesia: Monitor Anesthesia Care | Site: Knee | Laterality: Left

## 2020-11-08 MED ORDER — LACTATED RINGERS IV BOLUS
500.0000 mL | Freq: Once | INTRAVENOUS | Status: AC
  Administered 2020-11-08: 16:00:00 500 mL via INTRAVENOUS

## 2020-11-08 MED ORDER — ORAL CARE MOUTH RINSE: 15.0000 mL | Freq: Once | OROMUCOSAL | Status: AC

## 2020-11-08 MED ORDER — LACTATED RINGERS IV SOLN: INTRAVENOUS | Status: DC | PRN

## 2020-11-08 MED ORDER — BUPIVACAINE LIPOSOME 1.3 % IJ SUSP
20.0000 mL | Freq: Once | INTRAMUSCULAR | Status: AC
  Filled 2020-11-08: qty 20

## 2020-11-08 MED ORDER — PROPOFOL 500 MG/50ML IV EMUL
INTRAVENOUS | Status: DC | PRN
  Administered 2020-11-08: 125 ug/kg/min via INTRAVENOUS

## 2020-11-08 MED ORDER — PROPOFOL 10 MG/ML IV BOLUS
INTRAVENOUS | Status: DC | PRN
  Administered 2020-11-08 (×3): 20 mg via INTRAVENOUS

## 2020-11-08 MED ORDER — METHOCARBAMOL 500 MG PO TABS
500.0000 mg | ORAL_TABLET | Freq: Four times a day (QID) | ORAL | Status: DC | PRN
  Administered 2020-11-08: 21:00:00 500 mg via ORAL

## 2020-11-08 MED ORDER — DEXAMETHASONE SODIUM PHOSPHATE 10 MG/ML IJ SOLN
INTRAMUSCULAR | Status: DC | PRN
  Administered 2020-11-08: 5 mg via INTRAVENOUS

## 2020-11-08 MED ORDER — BUPIVACAINE-EPINEPHRINE (PF) 0.25% -1:200000 IJ SOLN
INTRAMUSCULAR | Status: AC
  Filled 2020-11-08: qty 30

## 2020-11-08 MED ORDER — TRANEXAMIC ACID-NACL 1000-0.7 MG/100ML-% IV SOLN
INTRAVENOUS | Status: AC
  Filled 2020-11-08: qty 100

## 2020-11-08 MED ORDER — BUPIVACAINE-EPINEPHRINE (PF) 0.5% -1:200000 IJ SOLN
INTRAMUSCULAR | Status: DC | PRN
  Administered 2020-11-08: 20 mL via PERINEURAL

## 2020-11-08 MED ORDER — FENTANYL CITRATE (PF) 100 MCG/2ML IJ SOLN
50.0000 ug | Freq: Once | INTRAMUSCULAR | Status: AC
  Administered 2020-11-08: 10:00:00 100 ug via INTRAVENOUS
  Filled 2020-11-08: qty 2

## 2020-11-08 MED ORDER — MIDAZOLAM HCL 5 MG/5ML IJ SOLN
INTRAMUSCULAR | Status: DC | PRN
  Administered 2020-11-08: 2 mg via INTRAVENOUS

## 2020-11-08 MED ORDER — ACETAMINOPHEN 500 MG PO TABS
1000.0000 mg | ORAL_TABLET | Freq: Once | ORAL | Status: AC
  Administered 2020-11-08: 10:00:00 1000 mg via ORAL
  Filled 2020-11-08: qty 2

## 2020-11-08 MED ORDER — PROPOFOL 1000 MG/100ML IV EMUL
INTRAVENOUS | Status: AC
  Filled 2020-11-08: qty 100

## 2020-11-08 MED ORDER — TRANEXAMIC ACID-NACL 1000-0.7 MG/100ML-% IV SOLN
1000.0000 mg | INTRAVENOUS | Status: AC
  Administered 2020-11-08: 13:00:00 1000 mg via INTRAVENOUS
  Filled 2020-11-08: qty 100

## 2020-11-08 MED ORDER — CEFAZOLIN SODIUM-DEXTROSE 2-4 GM/100ML-% IV SOLN
2.0000 g | Freq: Once | INTRAVENOUS | Status: AC
  Administered 2020-11-08: 17:00:00 2 g via INTRAVENOUS

## 2020-11-08 MED ORDER — CEFAZOLIN SODIUM-DEXTROSE 2-4 GM/100ML-% IV SOLN
2.0000 g | INTRAVENOUS | Status: AC
  Administered 2020-11-08: 13:00:00 2 g via INTRAVENOUS
  Filled 2020-11-08: qty 100

## 2020-11-08 MED ORDER — STERILE WATER FOR IRRIGATION IR SOLN
Status: DC | PRN
  Administered 2020-11-08: 2000 mL

## 2020-11-08 MED ORDER — 0.9 % SODIUM CHLORIDE (POUR BTL) OPTIME
TOPICAL | Status: DC | PRN
  Administered 2020-11-08: 14:00:00 1000 mL

## 2020-11-08 MED ORDER — SODIUM CHLORIDE (PF) 0.9 % IJ SOLN
INTRAMUSCULAR | Status: AC
  Filled 2020-11-08: qty 50

## 2020-11-08 MED ORDER — MIDAZOLAM HCL 2 MG/2ML IJ SOLN
1.0000 mg | Freq: Once | INTRAMUSCULAR | Status: AC
  Administered 2020-11-08: 10:00:00 1 mg via INTRAVENOUS
  Filled 2020-11-08: qty 2

## 2020-11-08 MED ORDER — BUPIVACAINE LIPOSOME 1.3 % IJ SUSP
INTRAMUSCULAR | Status: DC | PRN
  Administered 2020-11-08: 20 mL

## 2020-11-08 MED ORDER — HYDROMORPHONE HCL 1 MG/ML IJ SOLN
0.2500 mg | INTRAMUSCULAR | Status: DC | PRN
Start: 2020-11-08 — End: 2020-11-09

## 2020-11-08 MED ORDER — TRANEXAMIC ACID-NACL 1000-0.7 MG/100ML-% IV SOLN
1000.0000 mg | Freq: Once | INTRAVENOUS | Status: AC
  Administered 2020-11-08: 19:00:00 1000 mg via INTRAVENOUS

## 2020-11-08 MED ORDER — METHOCARBAMOL 500 MG IVPB - SIMPLE MED: 500.0000 mg | Freq: Four times a day (QID) | INTRAVENOUS | Status: DC | PRN

## 2020-11-08 MED ORDER — BUPIVACAINE-EPINEPHRINE 0.25% -1:200000 IJ SOLN
INTRAMUSCULAR | Status: DC | PRN
  Administered 2020-11-08: 30 mL

## 2020-11-08 MED ORDER — CHLORHEXIDINE GLUCONATE 0.12 % MT SOLN
15.0000 mL | Freq: Once | OROMUCOSAL | Status: AC
  Administered 2020-11-08: 10:00:00 15 mL via OROMUCOSAL

## 2020-11-08 MED ORDER — LACTATED RINGERS IV BOLUS
250.0000 mL | Freq: Once | INTRAVENOUS | Status: AC
  Administered 2020-11-08: 16:00:00 250 mL via INTRAVENOUS

## 2020-11-08 MED ORDER — SODIUM CHLORIDE 0.9 % IR SOLN
Status: DC | PRN
  Administered 2020-11-08: 1000 mL

## 2020-11-08 MED ORDER — POVIDONE-IODINE 10 % EX SWAB
2.0000 "application " | Freq: Once | CUTANEOUS | Status: AC
  Administered 2020-11-08: 2 via TOPICAL

## 2020-11-08 MED ORDER — METHOCARBAMOL 500 MG PO TABS
ORAL_TABLET | ORAL | Status: AC
  Filled 2020-11-08: qty 1

## 2020-11-08 MED ORDER — LIDOCAINE HCL (CARDIAC) PF 100 MG/5ML IV SOSY
PREFILLED_SYRINGE | INTRAVENOUS | Status: DC | PRN
  Administered 2020-11-08: 80 mg via INTRAVENOUS

## 2020-11-08 MED ORDER — SODIUM CHLORIDE (PF) 0.9 % IJ SOLN
INTRAMUSCULAR | Status: DC | PRN
  Administered 2020-11-08: 30 mL

## 2020-11-08 MED ORDER — LACTATED RINGERS IV SOLN: INTRAVENOUS | Status: DC

## 2020-11-08 MED ORDER — MIDAZOLAM HCL 2 MG/2ML IJ SOLN
INTRAMUSCULAR | Status: AC
  Filled 2020-11-08: qty 2

## 2020-11-08 MED ORDER — CEFAZOLIN SODIUM-DEXTROSE 2-4 GM/100ML-% IV SOLN
INTRAVENOUS | Status: AC
  Filled 2020-11-08: qty 100

## 2020-11-08 MED ORDER — ONDANSETRON HCL 4 MG/2ML IJ SOLN
INTRAMUSCULAR | Status: DC | PRN
  Administered 2020-11-08: 4 mg via INTRAVENOUS

## 2020-11-08 MED ORDER — PHENYLEPHRINE HCL-NACL 10-0.9 MG/250ML-% IV SOLN
INTRAVENOUS | Status: DC | PRN
  Administered 2020-11-08: 25 ug/min via INTRAVENOUS

## 2020-11-08 SURGICAL SUPPLY — 62 items
ATTUNE MED DOME PAT 41 KNEE (Knees) ×2 IMPLANT
ATTUNE MED DOME PAT 41MM KNEE (Knees) ×1 IMPLANT
ATTUNE PS FEM LT SZ 8 CEM KNEE (Femur) ×3 IMPLANT
ATTUNE PSRP INSR SZ8 12 KNEE (Insert) ×2 IMPLANT
ATTUNE PSRP INSR SZ8 12MMKNEE (Insert) ×1 IMPLANT
BAG ZIPLOCK 12X15 (MISCELLANEOUS) ×3 IMPLANT
BASE TIBIAL ROT PLAT SZ 8 KNEE (Knees) ×1 IMPLANT
BLADE SAGITTAL 25.0X1.19X90 (BLADE) ×4 IMPLANT
BLADE SAGITTAL 25.0X1.19X90MM (BLADE) ×2
BLADE SAW SGTL 13.0X1.19X90.0M (BLADE) ×3 IMPLANT
BLADE SAW SGTL 81X20 HD (BLADE) IMPLANT
BLADE SURG SZ10 CARB STEEL (BLADE) ×6 IMPLANT
BNDG ELASTIC 4X5.8 VLCR STR LF (GAUZE/BANDAGES/DRESSINGS) ×3 IMPLANT
BNDG ELASTIC 6X5.8 VLCR STR LF (GAUZE/BANDAGES/DRESSINGS) ×3 IMPLANT
BOOTIES KNEE HIGH SLOAN (MISCELLANEOUS) ×3 IMPLANT
BOWL SMART MIX CTS (DISPOSABLE) ×3 IMPLANT
CEMENT HV SMART SET (Cement) ×6 IMPLANT
COVER WAND RF STERILE (DRAPES) IMPLANT
DECANTER SPIKE VIAL GLASS SM (MISCELLANEOUS) ×3 IMPLANT
DRAPE U-SHAPE 47X51 STRL (DRAPES) ×3 IMPLANT
DRSG AQUACEL AG ADV 3.5X10 (GAUZE/BANDAGES/DRESSINGS) ×3 IMPLANT
DRSG PAD ABDOMINAL 8X10 ST (GAUZE/BANDAGES/DRESSINGS) ×3 IMPLANT
ELECT REM PT RETURN 15FT ADLT (MISCELLANEOUS) ×3 IMPLANT
GAUZE SPONGE 4X4 12PLY STRL (GAUZE/BANDAGES/DRESSINGS) ×3 IMPLANT
GAUZE XEROFORM 1X8 LF (GAUZE/BANDAGES/DRESSINGS) ×3 IMPLANT
GLOVE BIOGEL PI IND STRL 8 (GLOVE) ×2 IMPLANT
GLOVE BIOGEL PI INDICATOR 8 (GLOVE) ×4
GLOVE ECLIPSE 7.5 STRL STRAW (GLOVE) ×6 IMPLANT
GOWN STRL REUS W/TWL XL LVL3 (GOWN DISPOSABLE) ×6 IMPLANT
HANDPIECE INTERPULSE COAX TIP (DISPOSABLE) ×2
HOLDER FOLEY CATH W/STRAP (MISCELLANEOUS) IMPLANT
HOOD PEEL AWAY FLYTE STAYCOOL (MISCELLANEOUS) ×9 IMPLANT
IMMOBILIZER KNEE 20 (SOFTGOODS) ×3
IMMOBILIZER KNEE 20 THIGH 36 (SOFTGOODS) ×1 IMPLANT
KIT TURNOVER KIT A (KITS) IMPLANT
MANIFOLD NEPTUNE II (INSTRUMENTS) ×3 IMPLANT
NDL SAFETY ECLIPSE 18X1.5 (NEEDLE) IMPLANT
NEEDLE HYPO 18GX1.5 SHARP (NEEDLE)
NS IRRIG 1000ML POUR BTL (IV SOLUTION) ×3 IMPLANT
PACK TOTAL KNEE CUSTOM (KITS) ×3 IMPLANT
PADDING CAST COTTON 6X4 STRL (CAST SUPPLIES) ×3 IMPLANT
PENCIL SMOKE EVACUATOR (MISCELLANEOUS) IMPLANT
PIN DRILL FIX HALF THREAD (BIT) ×3 IMPLANT
PIN STEINMAN FIXATION KNEE (PIN) ×3 IMPLANT
PROTECTOR NERVE ULNAR (MISCELLANEOUS) ×3 IMPLANT
SET HNDPC FAN SPRY TIP SCT (DISPOSABLE) ×1 IMPLANT
STAPLER VISISTAT 35W (STAPLE) IMPLANT
SUT VIC AB 0 CT1 27 (SUTURE) ×4
SUT VIC AB 0 CT1 27XBRD ANTBC (SUTURE) ×2 IMPLANT
SUT VIC AB 1 CT1 27 (SUTURE) ×4
SUT VIC AB 1 CT1 27XBRD ANTBC (SUTURE) ×2 IMPLANT
SUT VIC AB 2-0 CT1 27 (SUTURE) ×2
SUT VIC AB 2-0 CT1 TAPERPNT 27 (SUTURE) ×1 IMPLANT
SWAB COLLECTION DEVICE MRSA (MISCELLANEOUS) ×3 IMPLANT
SWAB CULTURE ESWAB REG 1ML (MISCELLANEOUS) ×3 IMPLANT
SYR 3ML LL SCALE MARK (SYRINGE) IMPLANT
TIBIAL BASE ROT PLAT SZ 8 KNEE (Knees) ×3 IMPLANT
TOWER CARTRIDGE SMART MIX (DISPOSABLE) IMPLANT
TRAY FOLEY MTR SLVR 16FR STAT (SET/KITS/TRAYS/PACK) ×3 IMPLANT
WATER STERILE IRR 1000ML POUR (IV SOLUTION) ×3 IMPLANT
WRAP KNEE MAXI GEL POST OP (GAUZE/BANDAGES/DRESSINGS) ×3 IMPLANT
YANKAUER SUCT BULB TIP NO VENT (SUCTIONS) ×3 IMPLANT

## 2020-11-08 NOTE — Anesthesia Postprocedure Evaluation (Signed)
Anesthesia Post Note  Patient: Wesley Brooks  Procedure(s) Performed: TOTAL KNEE REVISION (Left Knee)     Patient location during evaluation: PACU Anesthesia Type: MAC, Spinal and Regional Level of consciousness: oriented and awake and alert Pain management: pain level controlled Vital Signs Assessment: post-procedure vital signs reviewed and stable Respiratory status: spontaneous breathing, respiratory function stable and patient connected to nasal cannula oxygen Cardiovascular status: blood pressure returned to baseline and stable Postop Assessment: no headache, no backache, no apparent nausea or vomiting, spinal receding and patient able to bend at knees Anesthetic complications: no   No complications documented.  Last Vitals:  Vitals:   11/08/20 1615 11/08/20 1630  BP: (!) 172/101   Pulse: 73 72  Resp: 20 (!) 22  Temp: 37.2 C   SpO2: 97%     Last Pain:  Vitals:   11/08/20 1615  TempSrc:   PainSc: 0-No pain                 Zorah Backes,W. EDMOND

## 2020-11-08 NOTE — Discharge Instructions (Signed)
INSTRUCTIONS AFTER JOINT REPLACEMENT   o Remove items at home which could result in a fall. This includes throw rugs or furniture in walking pathways o ICE to the affected joint every three hours while awake for 30 minutes at a time, for at least the first 3-5 days, and then as needed for pain and swelling.  Continue to use ice for pain and swelling. You may notice swelling that will progress down to the foot and ankle.  This is normal after surgery.  Elevate your leg when you are not up walking on it.   o Continue to use the breathing machine you got in the hospital (incentive spirometer) which will help keep your temperature down.  It is common for your temperature to cycle up and down following surgery, especially at night when you are not up moving around and exerting yourself.  The breathing machine keeps your lungs expanded and your temperature down.   DIET:  As you were doing prior to hospitalization, we recommend a well-balanced diet.  DRESSING / WOUND CARE / SHOWERING An Ace bandage was applied to your operative knee.  Keep this in place for 2 days.  You may remove it on day 3.  There is a waterproof bandage underneath the Ace wraps.  Keep this in place until we see you in clinic in 2 weeks.  Do not apply oils, lotions or creams on or around the waterproof bandage. Keep the surgical dressing until follow up.  The dressing is water proof, so you can shower without any extra covering.  IF THE DRESSING FALLS OFF or the wound gets wet inside, change the dressing with sterile gauze.  Please use good hand washing techniques before changing the dressing.  Do not use any lotions or creams on the incision until instructed by your surgeon.    ACTIVITY  o Increase activity slowly as tolerated, but follow the weight bearing instructions below.   o No driving for 6 weeks or until further direction given by your physician.  You cannot drive while taking narcotics.  o No lifting or carrying greater than  10 lbs. until further directed by your surgeon. o Avoid periods of inactivity such as sitting longer than an hour when not asleep. This helps prevent blood clots.  o You may return to work once you are authorized by your doctor.     WEIGHT BEARING   Weight bearing as tolerated with assist device (walker, cane, etc) as directed, use it as long as suggested by your surgeon or therapist, typically at least 4-6 weeks.   EXERCISES  Results after joint replacement surgery are often greatly improved when you follow the exercise, range of motion and muscle strengthening exercises prescribed by your doctor. Safety measures are also important to protect the joint from further injury. Any time any of these exercises cause you to have increased pain or swelling, decrease what you are doing until you are comfortable again and then slowly increase them. If you have problems or questions, call your caregiver or physical therapist for advice.   Rehabilitation is important following a joint replacement. After just a few days of immobilization, the muscles of the leg can become weakened and shrink (atrophy).  These exercises are designed to build up the tone and strength of the thigh and leg muscles and to improve motion. Often times heat used for twenty to thirty minutes before working out will loosen up your tissues and help with improving the range of motion but do  not use heat for the first two weeks following surgery (sometimes heat can increase post-operative swelling).   These exercises can be done on a training (exercise) mat, on the floor, on a table or on a bed. Use whatever works the best and is most comfortable for you.    Use music or television while you are exercising so that the exercises are a pleasant break in your day. This will make your life better with the exercises acting as a break in your routine that you can look forward to.   Perform all exercises about fifteen times, three times per day or  as directed.  You should exercise both the operative leg and the other leg as well.  Exercises include:   . Quad Sets - Tighten up the muscle on the front of the thigh (Quad) and hold for 5-10 seconds.   . Straight Leg Raises - With your knee straight (if you were given a brace, keep it on), lift the leg to 60 degrees, hold for 3 seconds, and slowly lower the leg.  Perform this exercise against resistance later as your leg gets stronger.  . Leg Slides: Lying on your back, slowly slide your foot toward your buttocks, bending your knee up off the floor (only go as far as is comfortable). Then slowly slide your foot back down until your leg is flat on the floor again.  [facility] Wings: Lying on your back spread your legs to the side as far apart as you can without causing discomfort.  . Hamstring Strength:  Lying on your back, push your heel against the floor with your leg straight by tightening up the muscles of your buttocks.  Repeat, but this time bend your knee to a comfortable angle, and push your heel against the floor.  You may put a pillow under the heel to make it more comfortable if necessary.   A rehabilitation program following joint replacement surgery can speed recovery and prevent re-injury in the future due to weakened muscles. Contact your doctor or a physical therapist for more information on knee rehabilitation.    CONSTIPATION  Constipation is defined medically as fewer than three stools per week and severe constipation as less than one stool per week.  Even if you have a regular bowel pattern at home, your normal regimen is likely to be disrupted due to multiple reasons following surgery.  Combination of anesthesia, postoperative narcotics, change in appetite and fluid intake all can affect your bowels.   YOU MUST use at least one of the following options; they are listed in order of increasing strength to get the job done.  They are all available over the counter, and you may need  to use some, POSSIBLY even all of these options:    Drink plenty of fluids (prune juice may be helpful) and high fiber foods Colace 100 mg by mouth twice a day  Senokot for constipation as directed and as needed Dulcolax (bisacodyl), take with full glass of water  Miralax (polyethylene glycol) once or twice a day as needed.  If you have tried all these things and are unable to have a bowel movement in the first 3-4 days after surgery call either your surgeon or your primary doctor.    If you experience loose stools or diarrhea, hold the medications until you stool forms back up.  If your symptoms do not get better within 1 week or if they get worse, check with your doctor.  If you  experience "the worst abdominal pain ever" or develop nausea or vomiting, please contact the office immediately for further recommendations for treatment.   ITCHING:  If you experience itching with your medications, try taking only a single pain pill, or even half a pain pill at a time.  You can also use Benadryl over the counter for itching or also to help with sleep.   TED HOSE STOCKINGS:  Use stockings on both legs until for at least 2 weeks or as directed by physician office. They may be removed at night for sleeping.  MEDICATIONS:  See your medication summary on the "After Visit Summary" that nursing will review with you.  You may have some home medications which will be placed on hold until you complete the course of blood thinner medication.  It is important for you to complete the blood thinner medication as prescribed.  PRECAUTIONS:  If you experience chest pain or shortness of breath - call 911 immediately for transfer to the hospital emergency department.   If you develop a fever greater that 101 F, purulent drainage from wound, increased redness or drainage from wound, foul odor from the wound/dressing, or calf pain - CONTACT YOUR SURGEON.                                                   FOLLOW-UP  APPOINTMENTS:  If you do not already have a post-op appointment, please call the office for an appointment to be seen by your surgeon.  Guidelines for how soon to be seen are listed in your "After Visit Summary", but are typically between 1-4 weeks after surgery.  OTHER INSTRUCTIONS:   Knee Replacement:  Do not place pillow under knee, focus on keeping the knee straight while resting. CPM instructions: 0-90 degrees, 2 hours in the morning, 2 hours in the afternoon, and 2 hours in the evening. Place foam block, curve side up under heel at all times except when in CPM or when walking.  DO NOT modify, tear, cut, or change the foam block in any way.   DENTAL ANTIBIOTICS:  In most cases prophylactic antibiotics for Dental procdeures after total joint surgery are not necessary.  Exceptions are as follows:  1. History of prior total joint infection  2. Severely immunocompromised (Organ Transplant, cancer chemotherapy, Rheumatoid biologic meds such as Humera)  3. Poorly controlled diabetes (A1C &gt; 8.0, blood glucose over 200)  If you have one of these conditions, contact your surgeon for an antibiotic prescription, prior to your dental procedure.   MAKE SURE YOU:  . Understand these instructions.  . Get help right away if you are not doing well or get worse.    Thank you for letting us be a part of your medical care team.  It is a privilege we respect greatly.  We hope these instructions will help you stay on track for a fast and full recovery!

## 2020-11-08 NOTE — Progress Notes (Signed)
Assisted Dr. Edmond Fitzgerald with left, ultrasound guided, adductor canal block. Side rails up, monitors on throughout procedure. See vital signs in flow sheet. Tolerated Procedure well. 

## 2020-11-08 NOTE — Progress Notes (Signed)
Orthopedic Tech Progress Note Patient Details:  Wesley Brooks 08-24-1956 [id number]  Ortho Devices Ortho Device/Splint Location: off cpm Ortho Device/Splint Interventions: Ordered,Application,Adjustment   Post Interventions Patient Tolerated: Fair Instructions Provided: Care of device   [name] 11/08/2020, 5:51 PM

## 2020-11-08 NOTE — H&P (Signed)
TOTAL KNEE REVISION ADMISSION H&P  Patient is being admitted for left revision total knee arthroplasty.  Subjective:  Chief Complaint:left knee pain.  HPI: Richardson Dubree, 64 y.o. male, has a history of pain and functional disability in the left knee(s) due to failed previous arthroplasty and patient has failed non-surgical conservative treatments for greater than 12 weeks to include NSAID's and/or analgesics, corticosteriod injections, weight reduction as appropriate and activity modification. The indications for the revision of the total knee arthroplasty are loosening of one or more components. Onset of symptoms was gradual starting 2 years ago with gradually worsening course since that time.  Prior procedures on the left knee(s) include unicompartmental arthroplasty.  Patient currently rates pain in the left knee(s) at 8 out of 10 with activity. There is night pain, worsening of pain with activity and weight bearing, pain that interferes with activities of daily living and joint swelling.  Patient has evidence of joint space narrowing and prosthetic loosening by imaging studies. This condition presents safety issues increasing the risk of falls. This patient has had Failure of unicompartmental arthroplasty.  There is no current active infection.  Patient Active Problem List   Diagnosis Date Noted   Primary osteoarthritis of right knee 12/25/2018   Past Medical History:  Diagnosis Date   Ankle pain    Depression    "secondary to pain"    Diabetes mellitus without complication (HCC)    TYPE 2    DJD (degenerative joint disease)    Elevated cholesterol    Hypertension    Nerve pain    OSA on CPAP    DOES NOT USE CPAP ALL THE TIME     Past Surgical History:  Procedure Laterality Date   arthroscopy  Right    knee x2   LEFT KNEE PARTIAL REPLACEMENT      ROTATOR CUFF REPAIR Bilateral    TOTAL KNEE ARTHROPLASTY Right 12/25/2018   Procedure: RIGHT TOTAL KNEE ARTHROPLASTY;   Surgeon: Jodi Geralds, MD;  Location: [facility];  Service: Orthopedics;  Laterality: Right;   VASECTOMY     X 2   VASECTOMY REVERSAL      Current Facility-Administered Medications  Medication Dose Route Frequency Provider Last Rate Last Admin   bupivacaine liposome (EXPAREL) 1.3 % injection 266 mg  20 mL Other Once Jodi Geralds, MD       ceFAZolin (ANCEF) IVPB 2g/100 mL premix  2 g Intravenous On Call to OR Jodi Geralds, MD       lactated ringers infusion   Intravenous Continuous Bethena Midget, MD 10 mL/hr at 11/08/20 1001 New Bag at 11/08/20 1001   tranexamic acid (CYKLOKAPRON) IVPB 1,000 mg  1,000 mg Intravenous To OR Jodi Geralds, MD       Facility-Administered Medications Ordered in Other Encounters  Medication Dose Route Frequency Provider Last Rate Last Admin   bupivacaine-epinephrine (MARCAINE W/ EPI) 0.5% -1:200000 injection   Peri-NEURAL Anesthesia Intra-op Gaynelle Adu, MD   20 mL at 11/08/20 1025   No Known Allergies  Social History   Tobacco Use   Smoking status: Current Some Day Smoker    Packs/day: 0.25    Years: 47.00    Pack years: 11.75    Types: Cigarettes   Smokeless tobacco: Never Used   Tobacco comment: 4 cigarettes a day   Substance Use Topics   Alcohol use: Yes    Comment: 1-2 shots a day , 3 times week     History reviewed. No pertinent family history.  Review of Systems  ROSObjective:  Physical Exam  Vital signs in last 24 hours: Temp:  [98.1 F (36.7 C)] 98.1 F (36.7 C) (12/29 0930) Pulse Rate:  [71-72] 72 (12/29 1025) Resp:  [17-19] 17 (12/29 1025) BP: (132-162)/(72-79) 132/72 (12/29 1020) SpO2:  [95 %-97 %] 95 % (12/29 1025) Weight:  [115.4 kg] 115.4 kg (12/29 1002) Well-developed well-nourished patient in no acute distress. Alert and oriented x3 HEENT:within normal limits Cardiac: Regular rate and rhythm Pulmonary: Lungs clear to auscultation Abdomen: Soft and nontender.  Normal active bowel sounds  Musculoskeletal:  (Left knee: Painful range of motion.  Limited range of motion.  No instability.  Trace effusion. Labs: Recent Results (from the past 2160 hour(s))  Glucose, capillary     Status: Abnormal   Collection Time: 11/06/20  8:09 AM  Result Value Ref Range   Glucose-Capillary 154 (H) 70 - 99 mg/dL    Comment: Glucose reference range applies only to samples taken after fasting for at least 8 hours.  Type and screen [facility]     Status: None   Collection Time: 11/06/20  8:38 AM  Result Value Ref Range   ABO/RH(D) O POS    Antibody Screen NEG    Sample Expiration 11/11/2020,2359    Extend sample reason      NO TRANSFUSIONS OR PREGNANCY IN THE PAST 3 MONTHS Performed at [facility] [address] [place], [place] [address]   Basic metabolic panel per protocol     Status: Abnormal   Collection Time: 11/06/20  8:48 AM  Result Value Ref Range   Sodium 138 135 - 145 mmol/L   Potassium 4.1 3.5 - 5.1 mmol/L   Chloride 97 (L) 98 - 111 mmol/L   CO2 27 22 - 32 mmol/L   Glucose, Bld 140 (H) 70 - 99 mg/dL    Comment: Glucose reference range applies only to samples taken after fasting for at least 8 hours.   BUN 25 (H) 8 - 23 mg/dL   Creatinine, Ser 9.37 (H) 0.61 - 1.24 mg/dL   Calcium 16.9 (H) 8.9 - 10.3 mg/dL   GFR, Estimated 55 (L) >60 mL/min    Comment: (NOTE) Calculated using the CKD-EPI Creatinine Equation (2021)    Anion gap 14 5 - 15    Comment: Performed at [facility], [address] [place], [place] [address]  Hemoglobin A1c per protocol     Status: Abnormal   Collection Time: 11/06/20  8:48 AM  Result Value Ref Range   Hgb A1c MFr Bld 6.7 (H) 4.8 - 5.6 %    Comment: (NOTE) Pre diabetes:          5.7%-6.4%  Diabetes:              >6.4%  Glycemic control for   <7.0% adults with diabetes    Mean Plasma Glucose 145.59 mg/dL    Comment: Performed at [facility], [address] [place], [place] [address]  CBC per  protocol     Status: Abnormal   Collection Time: 11/06/20  8:48 AM  Result Value Ref Range   WBC 11.7 (H) 4.0 - 10.5 K/uL   RBC 4.69 4.22 - 5.81 MIL/uL   Hemoglobin 14.9 13.0 - 17.0 g/dL   HCT 51.0 25.8 - 52.7 %   MCV 93.8 80.0 - 100.0 fL   MCH 31.8 26.0 - 34.0 pg   MCHC 33.9 30.0 - 36.0 g/dL   RDW  12.8 11.5 - 15.5 %   Platelets 469 (H) 150 - 400 K/uL   nRBC 0.0 0.0 - 0.2 %    Comment: Performed at [facility], [address] [place], [place] [address]  Surgical pcr screen     Status: Abnormal   Collection Time: 11/06/20  9:27 AM   Specimen: Nasal Mucosa; Nasal Swab  Result Value Ref Range   MRSA, PCR NEGATIVE NEGATIVE   Staphylococcus aureus POSITIVE (A) NEGATIVE    Comment: (NOTE) The Xpert SA Assay (FDA approved for NASAL specimens in patients 62 years of age and older), is one component of a comprehensive surveillance program. It is not intended to diagnose infection nor to guide or monitor treatment. Performed at [facility], [address] [place], [place] [address]   SARS CORONAVIRUS 2 (TAT 6-24 HRS) Nasopharyngeal Nasopharyngeal Swab     Status: None   Collection Time: 11/06/20 10:02 AM   Specimen: Nasopharyngeal Swab  Result Value Ref Range   SARS Coronavirus 2 NEGATIVE NEGATIVE    Comment: (NOTE) SARS-CoV-2 target nucleic acids are NOT DETECTED.  The SARS-CoV-2 RNA is generally detectable in upper and lower respiratory specimens during the acute phase of infection. Negative results do not preclude SARS-CoV-2 infection, do not rule out co-infections with other pathogens, and should not be used as the sole basis for treatment or other patient management decisions. Negative results must be combined with clinical observations, patient history, and epidemiological information. The expected result is Negative.  Fact Sheet for Patients: HairSlick.nohttps://www.fda.gov/media/138098/download  Fact Sheet for Healthcare  Providers: quierodirigir.comhttps://www.fda.gov/media/138095/download  This test is not yet approved or cleared by the [place] FDA and  has been authorized for detection and/or diagnosis of SARS-CoV-2 by FDA under an Emergency Use Authorization (EUA). This EUA will remain  in effect (meaning this test can be used) for the duration of the COVID-19 declaration under Se ction 564(b)(1) of the Act, 21 U.S.C. section 360bbb-3(b)(1), unless the authorization is terminated or revoked sooner.  Performed at [facility], [address] [place], [place] [address]   Comprehensive metabolic panel     Status: Abnormal   Collection Time: 11/08/20  9:50 AM  Result Value Ref Range   Sodium 137 135 - 145 mmol/L   Potassium 4.4 3.5 - 5.1 mmol/L   Chloride 99 98 - 111 mmol/L   CO2 25 22 - 32 mmol/L   Glucose, Bld 137 (H) 70 - 99 mg/dL    Comment: Glucose reference range applies only to samples taken after fasting for at least 8 hours.   BUN 25 (H) 8 - 23 mg/dL   Creatinine, Ser 9.141.38 (H) 0.61 - 1.24 mg/dL   Calcium 78.210.4 (H) 8.9 - 10.3 mg/dL   Total Protein 8.4 (H) 6.5 - 8.1 g/dL   Albumin 4.4 3.5 - 5.0 g/dL   AST 956171 (H) 15 - 41 U/L   ALT 76 (H) 0 - 44 U/L   Alkaline Phosphatase 111 38 - 126 U/L   Total Bilirubin 0.8 0.3 - 1.2 mg/dL   GFR, Estimated 58 (L) >60 mL/min    Comment: (NOTE) Calculated using the CKD-EPI Creatinine Equation (2021)    Anion gap 13 5 - 15    Comment: Performed at [facility], [address] [place], [place] [address]  Protime-INR     Status: None   Collection Time: 11/08/20  9:50 AM  Result Value Ref Range   Prothrombin Time 13.8 11.4 -  15.2 seconds   INR 1.1 0.8 - 1.2    Comment: (NOTE) INR goal varies based on device and disease states. Performed at [facility], [address] [place], [place] [address]   APTT     Status: None   Collection Time: 11/08/20  9:50 AM  Result Value Ref Range   aPTT 34 24 - 36 seconds     Comment: Performed at [facility], [address], [place], [place] [address]  Glucose, capillary     Status: Abnormal   Collection Time: 11/08/20  9:58 AM  Result Value Ref Range   Glucose-Capillary 143 (H) 70 - 99 mg/dL    Comment: Glucose reference range applies only to samples taken after fasting for at least 8 hours.   Estimated body mass index is 34.97 kg/m as calculated from the following:   Height as of this encounter: 5' 11.5" (1.816 m).   Weight as of this encounter: 115.4 kg.  Imaging Review Plain radiographs demonstrate severe degenerative joint disease of the left knee(s). The overall alignment is neutral.There is evidence of loosening of the tibial components. The bone quality appears to be fair for age and reported activity level. There is lucency around the tibial component.  There is not a huge cement mantle around either of the components.    Assessment/Plan:  End stage arthritis, left knee(s) with failed previous arthroplasty.   The patient history, physical examination, clinical judgment of the provider and imaging studies are consistent with end stage degenerative joint disease of the left knee(s), previous total knee arthroplasty. Revision total knee arthroplasty is deemed medically necessary. The treatment options including medical management, injection therapy, arthroscopy and revision arthroplasty were discussed at length. The risks and benefits of revision total knee arthroplasty were presented and reviewed. The risks due to aseptic loosening, infection, stiffness, patella tracking problems, thromboembolic complications and other imponderables were discussed. The patient acknowledged the explanation, agreed to proceed with the plan and consent was signed. Patient is being admitted for inpatient treatment for surgery, pain control, PT, OT, prophylactic antibiotics, VTE prophylaxis, progressive ambulation and ADL's and discharge planning.The patient is  planning to be discharged home with home health services

## 2020-11-08 NOTE — Transfer of Care (Signed)
Immediate Anesthesia Transfer of Care Note  Patient: Wesley Brooks  Procedure(s) Performed: TOTAL KNEE REVISION (Left Knee)  Patient Location: PACU  Anesthesia Type:Spinal  Level of Consciousness: awake, alert , oriented and patient cooperative  Airway & Oxygen Therapy: Patient Spontanous Breathing and Patient connected to face mask oxygen  Post-op Assessment: Report given to RN and Post -op Vital signs reviewed and stable  Post vital signs: Reviewed and stable  Last Vitals:  Vitals Value Taken Time  BP 139/81 11/08/20 1517  Temp    Pulse 64 11/08/20 1521  Resp 19 11/08/20 1521  SpO2 100 % 11/08/20 1521  Vitals shown include unvalidated device data.  Last Pain:  Vitals:   11/08/20 1002  TempSrc:   PainSc: 3       Patients Stated Pain Goal: 3 (11/08/20 1002)  Complications: No complications documented.

## 2020-11-08 NOTE — Brief Op Note (Signed)
11/08/2020  2:51 PM  PATIENT:  Wesley Brooks  64 y.o. male  PRE-OPERATIVE DIAGNOSIS:  LEFT FAILED UNICOMPARTMENTAL KNEE  POST-OPERATIVE DIAGNOSIS:  LEFT FAILED UNICOMPARTMENTAL KNEE  PROCEDURE:  Procedure(s): TOTAL KNEE REVISION (Left)  SURGEON:  Surgeon(s) and Role:    Jodi Geralds, MD - Primary  PHYSICIAN ASSISTANT:   ASSISTANTS: Greggory Stallion [facility]   ANESTHESIA:   spinal  EBL:  50 mL   BLOOD ADMINISTERED:none  DRAINS: none   LOCAL MEDICATIONS USED:  MARCAINE    and OTHER experel  SPECIMEN:  No Specimen  DISPOSITION OF SPECIMEN:  N/A  COUNTS:  YES  TOURNIQUET:   Total Tourniquet Time Documented: Thigh (Left) - 72 minutes Total: Thigh (Left) - 72 minutes   DICTATION: .Other Dictation: Dictation Number [id number]  PLAN OF CARE: Admit for overnight observation  PATIENT DISPOSITION:  PACU - hemodynamically stable.   Delay start of Pharmacological VTE agent (>24hrs) due to surgical blood loss or risk of bleeding: no

## 2020-11-08 NOTE — Evaluation (Signed)
Physical Therapy Evaluation Patient Details Name: Wesley Brooks MRN: [id number] DOB: 28-Feb-1956 Today's Date: 11/08/2020   History of Present Illness  Patient is 64 y.o. 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    Clinical Impression  Wesley Brooks is a 64 y.o. male POD 0 s/p Lt TKR (conversion from UKA to TKA). Patient reports independence with mobility at baseline. Patient is now limited by functional impairments (see PT problem list below) and requires min guard/supervision for transfers and gait with RW. Patient was able to ambulate ~120 feet with RW and min guard/supervision and cues for safe walker management. Patient educated on safe sequencing for stair mobility and verbalized safe guarding position for people assisting with mobility. Patient instructed in exercises to facilitate ROM and circulation. Patient will benefit from continued skilled PT interventions to address impairments and progress towards PLOF. Patient has met mobility goals at adequate level for discharge home; will continue to follow if pt continues acute stay to progress towards Mod I goals.     Follow Up Recommendations Follow surgeon's recommendation for DC plan and follow-up therapies    Equipment Recommendations  None recommended by PT    Recommendations for Other Services       Precautions / Restrictions Precautions Precautions: Fall Restrictions Weight Bearing Restrictions: No RLE Weight Bearing: Weight bearing as tolerated LLE Weight Bearing: Weight bearing as tolerated      Mobility  Bed Mobility Overal bed mobility: Needs Assistance Bed Mobility: Supine to Sit;Sit to Supine     Supine to sit: Supervision Sit to supine: Supervision   General bed mobility comments: no assist needed, pt taking some extra time and HOB slightly elevated.    Transfers Overall transfer level: Needs assistance Equipment used: Rolling walker (2  wheeled) Transfers: Sit to/from [facility] Sit to Stand: Min guard;Supervision Stand pivot transfers: Min guard;Supervision       General transfer comment: Min guard initially with progression to supervision for safety. cues for technique/hand placement with RW, no assist for power up and pt steady in standing.  Ambulation/Gait Ambulation/Gait assistance: Min guard;Supervision Gait Distance (Feet): 120 Feet Assistive device: Rolling walker (2 wheeled) Gait Pattern/deviations: Step-to pattern;Decreased stride length;Decreased weight shift to left Gait velocity: decr   General Gait Details: cues for safe step pattern and proximity to RW. no overt LOB or buckling at Lt knee noted.  Stairs Stairs: Yes Stairs assistance: Min guard Stair Management: No rails;Step to pattern;Forwards;With walker Number of Stairs: 1 General stair comments: cues for safe step sequencing and assist from family to steady walker. cues for "up with the good, down with the bad" and walker position. no overt LOB noted.  Wheelchair Mobility    Modified Rankin (Stroke Patients Only)       Balance Overall balance assessment: Mild deficits observed, not formally tested (time spent with standing at EOB to clean LE's and perianal area with washcloths. pt able to clean himself with min guard for safety and single UE support on RW or EOB.)                                           Pertinent Vitals/Pain Pain Assessment: 0-10 Pain Score: 3  Pain Location: Lt knee Pain Descriptors / Indicators: Aching;Discomfort Pain Intervention(s): Limited activity within patient's tolerance;Monitored during session;Repositioned  Home Living Family/patient expects to be discharged to:: Private residence Living Arrangements: Spouse/significant other Available Help at Discharge: Family Type of Home: House Home Access: Stairs to enter Entrance Stairs-Rails: Right Entrance Stairs-Number of  Steps: 1 onto [address]: One level Home Equipment: Environmental consultant - 2 wheels;Bedside commode;Shower seat;Grab bars - tub/shower      Prior Function Level of Independence: Independent               Hand Dominance   Dominant Hand: Right    Extremity/Trunk Assessment   Upper Extremity Assessment Upper Extremity Assessment: Overall WFL for tasks assessed    Lower Extremity Assessment Lower Extremity Assessment: LLE deficits/detail LLE Deficits / Details: good quad activation, no extensor lag with SLR LLE Sensation: WNL LLE Coordination: WNL    Cervical / Trunk Assessment Cervical / Trunk Assessment: Normal  Communication   Communication: No difficulties  Cognition Arousal/Alertness: Awake/alert Behavior During Therapy: WFL for tasks assessed/performed Overall Cognitive Status: Within Functional Limits for tasks assessed                                        General Comments General comments (skin integrity, edema, etc.): session broken up by episode of bowel incontinence secondary to spinal block and pt spent some time in bathroom to void.    Exercises Total Joint Exercises Ankle Circles/Pumps: AROM;Both;10 reps;Supine   Assessment/Plan    PT Assessment Patient needs continued PT services  PT Problem List Decreased strength;Decreased range of motion;Decreased activity tolerance;Decreased balance;Decreased mobility;Decreased knowledge of use of DME;Decreased knowledge of precautions       PT Treatment Interventions DME instruction;Gait training;Stair training;Functional mobility training;Therapeutic activities;Therapeutic exercise;Balance training;Patient/family education    PT Goals (Current goals can be found in the Care Plan section)  Acute Rehab PT Goals Patient Stated Goal: regain independence and knee to stop hurting PT Goal Formulation: With patient Time For Goal Achievement: 11/15/20 Potential to Achieve Goals: Good    Frequency  7X/week   Barriers to discharge        Co-evaluation               AM-PAC PT "6 Clicks" Mobility  Outcome Measure Help needed turning from your back to your side while in a flat bed without using bedrails?: None Help needed moving from lying on your back to sitting on the side of a flat bed without using bedrails?: None Help needed moving to and from a bed to a chair (including a wheelchair)?: A Little Help needed standing up from a chair using your arms (e.g., wheelchair or bedside chair)?: A Little Help needed to walk in hospital room?: A Little Help needed climbing 3-5 steps with a railing? : A Little 6 Click Score: 20    End of Session Equipment Utilized During Treatment: Gait belt Activity Tolerance: Patient tolerated treatment well Patient left: in bed;with call bell/phone within reach Nurse Communication: Mobility status PT Visit Diagnosis: Muscle weakness (generalized) (M62.81);Difficulty in walking, not elsewhere classified (R26.2)    Time: 1746-1914 (24 minutes pt in bathroom voiding) PT Time Calculation (min) (ACUTE ONLY): 88 min   Charges:   PT Evaluation $PT Eval Low Complexity: 1 Low PT Treatments $Gait Training: 8-22 mins $Therapeutic Exercise: 8-22 mins $Therapeutic Activity: 8-22 mins        Verner Mould, DPT Acute Rehabilitation Services Office [phone] Pager [phone]    Jacques Navy 11/08/2020,  7:30 PM

## 2020-11-08 NOTE — Progress Notes (Signed)
Orthopedic Tech Progress Note Patient Details:  Wesley Brooks [birth year] [id number]     Post Interventions Patient Tolerated: Fair Instructions Provided: Care of device Ortho Devices Ortho Device/Splint Location: LLE 0-90 Ortho Device/Splint Interventions: Ordered,Application,Adjustment   Post Interventions Patient Tolerated: Fair Instructions Provided: Care of device   [name] 11/08/2020, 3:52 PM

## 2020-11-08 NOTE — Anesthesia Preprocedure Evaluation (Addendum)
Anesthesia Evaluation  Patient identified by MRN, date of birth, ID band Patient awake    Reviewed: Allergy & Precautions, H&P , NPO status , Patient's Chart, lab work & pertinent test results, reviewed documented beta blocker date and time   Airway Mallampati: III  TM Distance: >3 FB Neck ROM: Full    Dental no notable dental hx. (+) Edentulous Upper, Partial Lower, Dental Advisory Given   Pulmonary sleep apnea , Current Smoker and Patient abstained from smoking.,    Pulmonary exam normal breath sounds clear to auscultation       Cardiovascular hypertension, Pt. on medications and Pt. on home beta blockers  Rhythm:Regular Rate:Normal     Neuro/Psych Depression negative neurological ROS     GI/Hepatic negative GI ROS, Neg liver ROS,   Endo/Other  diabetes, Type 2, Oral Hypoglycemic AgentsMorbid obesity  Renal/GU negative Renal ROS  negative genitourinary   Musculoskeletal  (+) Arthritis , Osteoarthritis,    Abdominal   Peds  Hematology negative hematology ROS (+)   Anesthesia Other Findings   Reproductive/Obstetrics negative OB ROS                            Anesthesia Physical Anesthesia Plan  ASA: III  Anesthesia Plan: Spinal and MAC   Post-op Pain Management:  Regional for Post-op pain   Induction: Intravenous  PONV Risk Score and Plan: 1 and Propofol infusion, Midazolam and Ondansetron  Airway Management Planned: Simple Face Mask  Additional Equipment:   Intra-op Plan:   Post-operative Plan:   Informed Consent: I have reviewed the patients History and Physical, chart, labs and discussed the procedure including the risks, benefits and alternatives for the proposed anesthesia with the patient or authorized representative who has indicated his/her understanding and acceptance.     Dental advisory given  Plan Discussed with: CRNA  Anesthesia Plan Comments:          Anesthesia Quick Evaluation

## 2020-11-08 NOTE — Anesthesia Procedure Notes (Signed)
Anesthesia Regional Block: Adductor canal block   Pre-Anesthetic Checklist: ,, timeout performed, Correct Patient, Correct Site, Correct Laterality, Correct Procedure, Correct Position, site marked, Risks and benefits discussed, pre-op evaluation,  At surgeon's request and post-op pain management  Laterality: Left  Prep: Maximum Sterile Barrier Precautions used, chloraprep       Needles:  Injection technique: Single-shot  Needle Type: Echogenic Stimulator Needle     Needle Length: 9cm  Needle Gauge: 21     Additional Needles:   Procedures:,,,, ultrasound used (permanent image in chart),,,,  Narrative:  Start time: 11/08/2020 10:15 AM End time: 11/08/2020 10:25 AM Injection made incrementally with aspirations every 5 mL.  Performed by: Personally  Anesthesiologist: Gaynelle Adu, MD  Additional Notes: 2% Lidocaine skin wheel.

## 2020-11-09 ENCOUNTER — Encounter (HOSPITAL_COMMUNITY): Payer: Self-pay | Admitting: Orthopedic Surgery

## 2020-11-09 NOTE — Op Note (Signed)
NAMENEMESIO, CASTRILLON MEDICAL RECORD DT:[id number] ACCOUNT [id number] DATE OF BIRTH:10-10-1956 FACILITY: [facility] LOCATION: [facility] PHYSICIAN:Herley Bernardini Starling Manns, MD  OPERATIVE REPORT  DATE OF PROCEDURE:  11/08/2020  PREOPERATIVE DIAGNOSIS:  Failed unicompartmental knee replacement, left.  POSTOPERATIVE DIAGNOSIS:  Failed unicompartmental knee replacement, left.  PROCEDURE:  Left total knee revision with revision of the entire femur and tibia.  SURGEON:  Jodi Geralds, MD  ASSISTANT:  Greggory Stallion, PA-C, was present for the entire case and assisted by retraction, manipulation of the leg, bone cuts, and closing to minimize OR time.  BRIEF HISTORY:  The patient is a 64 year old male with a long history of significant complaints of left knee pain.  He had a unicompartmental knee replacement done at another facility some years ago.  He said he never really got tremendous pain relief  with that and because of failure of conservative care, he is taken to the operating room for left knee revision.  Preoperative imaging showed questionable looseness of the components, preoperative lab work did not show evidence of infection.  He was  brought to the operating room for revision of all components.  DESCRIPTION OF PROCEDURE:  The patient was taken to the operating room after adequate anesthesia was obtained with a spinal anesthetic, the patient was placed supine on the operating table.  Left leg was prepped and draped in usual sterile fashion.   Following this, the leg was exsanguinated, blood pressure tourniquet inflated to 250 mmHg.  An incision was made encompassing his old incision, extending proximally and distally in subcutaneous tissue down to the level of the extensor mechanism.  A  medial parapatellar arthrotomy was then undertaken and cultures were taken and sent to the lab.  At that point, the exposure was of the distal femur, lateral meniscus was removed, anterior and posterior cruciates were removed,  retropatellar fat pad and  the synovium on the anterior aspect of the femur.  Once this was done, attention was turned to intramedullary pilot hole drilled in the femur.  The distal alignment was set to resection of 9 mm of distal bone with a 4-degree valgus inclination.  Once  this was undertaken, the distal femoral cut is made and it was cut on the femoral component down to the peg of the femoral component.  At this point, the femoral component did not appear to be loose.  I took an ACL saw.  We removed the femoral component.   Once the femoral component was removed, we then completed the distal cut.  We then marked the epicondylar axis and Whitesides line.  These 2 were perpendicular, so I felt pretty good about the rotation.  We had done a total knee replacement on his  other side, it was a size 8 on the femur and this appeared to be the right size sized on the lateral femoral condyle, medial femoral condyle was now deficient after removal of the component.  We then rotated this to the epicondylar axis with slight  external rotation and pinned it in place for an 8.  Anterior and posterior cuts were made chamfers and box.  Attention was then turned to the tibia, cut just below the tibial component and once we did this, we were able to get an osteotome and removed  the tibial component and then continued to cut just below the implant on that side.  Once that was completed, the attention was turned towards sizing, the tibia sized to an 8 just like the opposite side and we put  this in place at this time, the Exparel  was instilled in the back of the knee and then the area of the nerve for postoperative pain control.  We then sized the tibia to an 8.  It was drilled and keeled and once that was completed, we took a look at some spacer blocks.  We put a 12 spacer and  got good extension.  We were actually pretty good in flexion as well.  At that point, I felt like 12 was going to be good.  We freehand cut the  patella down to 14 mm of bone.  We used a 41 mm patella and then put that rotated to the femoral component and  excellent range of motion and stability was achieved.  At this point, all trial components were removed.  Posterior cuts were made along the back of the femur to make sure there was nothing in the back.  Once this was done, the wound was irrigated,  suctioned dry.  Final components were cemented into place, size 8 femur, size 8 tibia, 12 mm bridging bearing trial was placed and a 41 all poly patella was placed and held with a clamp.  All excess bone cement was removed and attention was then turned  towards the ceiling.  Exparel throughout the synovial reflections completely around the knee and all Exparel with Marcaine with epinephrine mixture was instilled throughout the knee at this point.  At this point, attention was turned towards final check  of the balancing.  I was happy with the balancing, we opened and placed the 12 polyethylene liner.  Excellent range of motion and stability were achieved here.  At this point, the attention was turned towards closure.  The medial parapatellar arthrotomy  was closed with 1 Vicryl running.  There were some old sutures and a little bit has been bothering him below the skin and so we removed each of the sutures on the backside of the patella and once that was completed, #1 Vicryl running was done and then  the skin was closed with 0 Vicryl, 2-0 Vicryl and skin staples.  Sterile compressive dressing was applied and the patient was taken to recovery was noted to be in satisfactory condition.  Estimated blood loss for procedure was minimal.  HN/NUANCE  D:11/08/2020 T:11/09/2020 JOB:[id number]

## 2020-11-12 LAB — AEROBIC CULTURE W GRAM STAIN (SUPERFICIAL SPECIMEN): Culture: NO GROWTH

## 2021-10-23 IMAGING — DX DG CHEST 2V
2 series · 2 of 2 positions shown · non-contrast
Comparison: Chest radiograph dated 12/17/2018

CLINICAL DATA: Preoperative assessment prior to knee replacement
surgery.

EXAM:
CHEST - 2 VIEW

[chest pa]
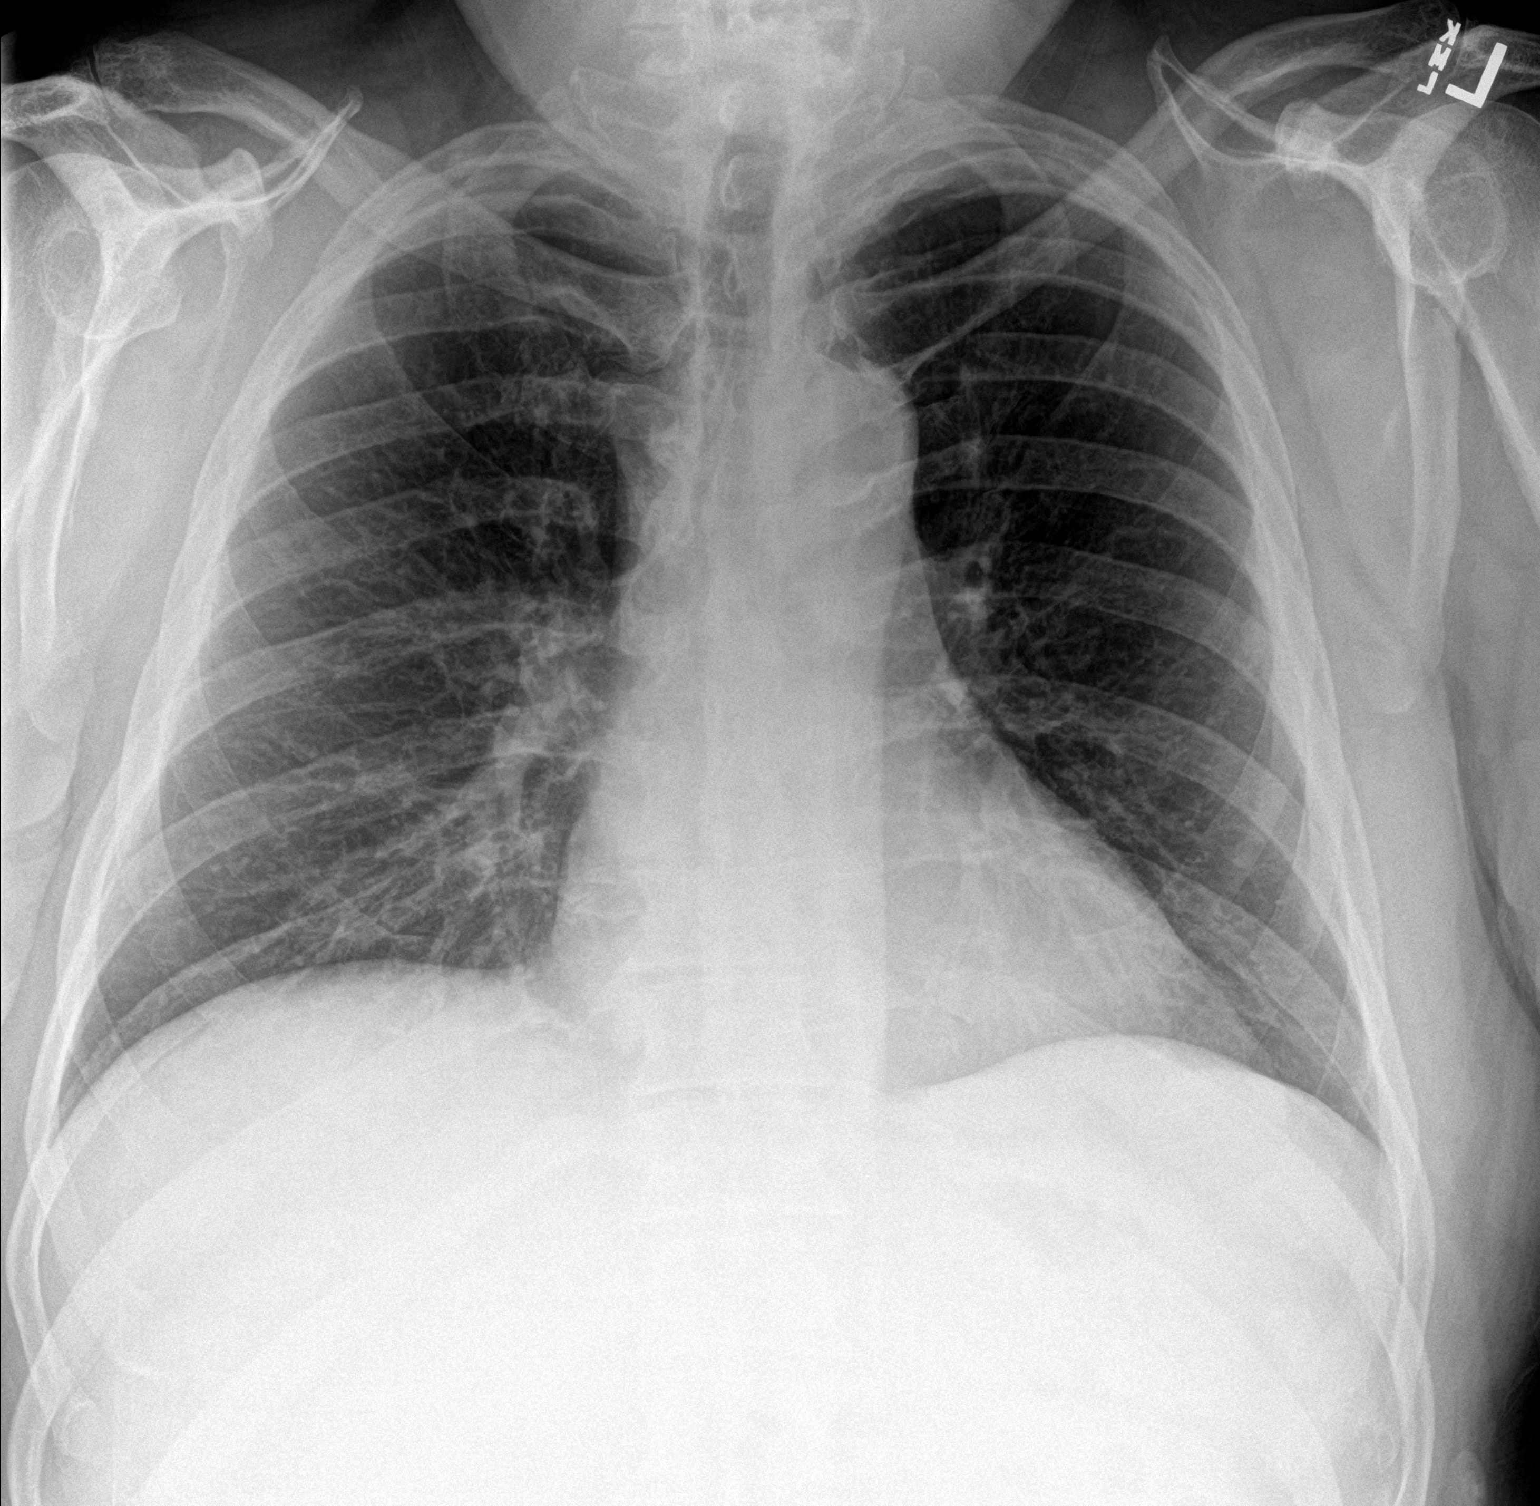

[chest lat]
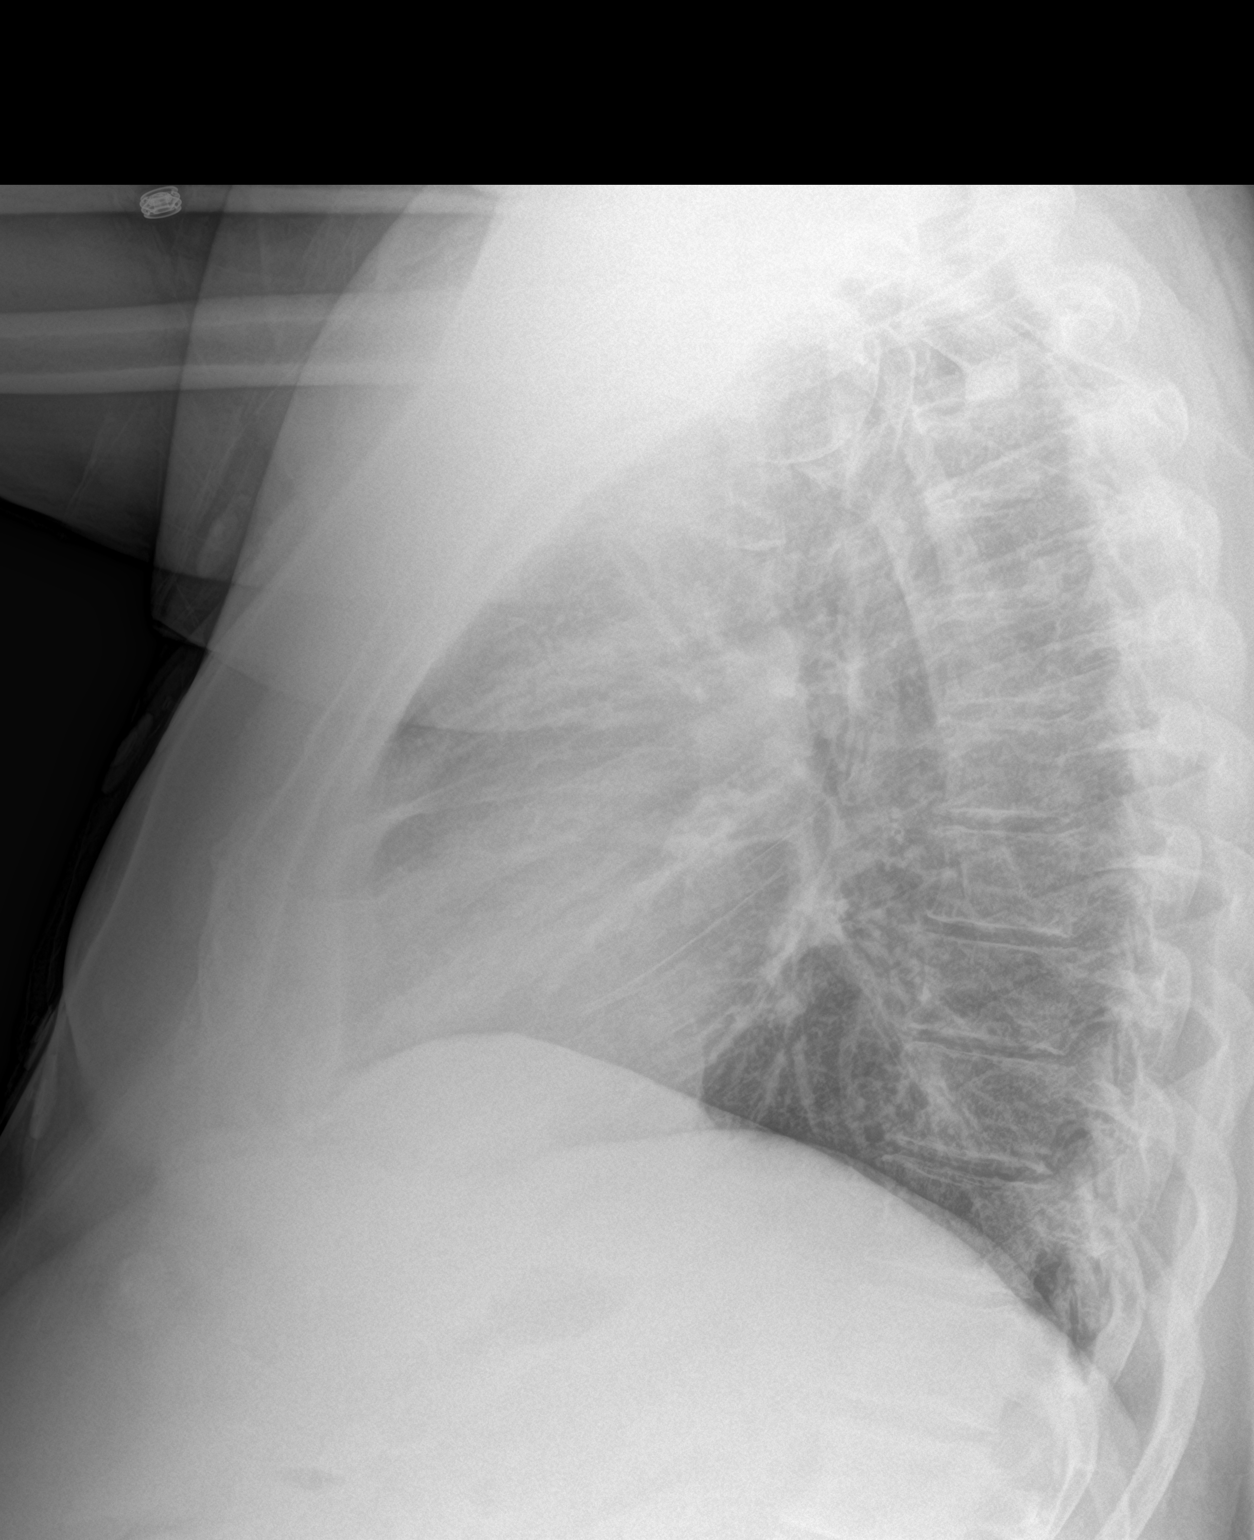

[2 of 2 positions shown; findings below may reference images not displayed]

FINDINGS: The heart size and mediastinal contours are within normal limits.
Both lungs are clear. The visualized skeletal structures are
unremarkable.
IMPRESSION: No active cardiopulmonary disease.
